# Patient Record
Sex: Female | Born: 1980 | Race: Black or African American | Hispanic: No | Marital: Single | Smoking: Never smoker
Health system: Southern US, Community
[De-identification: ages and names within clinical notes are randomized; demographics above are authoritative.]

## PROBLEM LIST (undated history)

## (undated) DIAGNOSIS — J45909 Unspecified asthma, uncomplicated: Secondary | ICD-10-CM

## (undated) DIAGNOSIS — M797 Fibromyalgia: Secondary | ICD-10-CM

## (undated) HISTORY — PX: TONSILLECTOMY: SUR1361

---

## 2013-07-28 ENCOUNTER — Emergency Department (HOSPITAL_COMMUNITY)
Admission: EM | Admit: 2013-07-28 | Discharge: 2013-07-28 | Disposition: A | Discharge status: Home / Self Care | Payer: Medicaid Other | Attending: Emergency Medicine | Admitting: Emergency Medicine

## 2013-07-28 ENCOUNTER — Encounter (HOSPITAL_COMMUNITY): Payer: Self-pay | Admitting: *Deleted

## 2013-07-28 DIAGNOSIS — J45909 Unspecified asthma, uncomplicated: Secondary | ICD-10-CM | POA: Insufficient documentation

## 2013-07-28 DIAGNOSIS — Z91040 Latex allergy status: Secondary | ICD-10-CM | POA: Insufficient documentation

## 2013-07-28 DIAGNOSIS — J329 Chronic sinusitis, unspecified: Secondary | ICD-10-CM | POA: Insufficient documentation

## 2013-07-28 DIAGNOSIS — R52 Pain, unspecified: Secondary | ICD-10-CM | POA: Insufficient documentation

## 2013-07-28 DIAGNOSIS — Z8739 Personal history of other diseases of the musculoskeletal system and connective tissue: Secondary | ICD-10-CM | POA: Insufficient documentation

## 2013-07-28 DIAGNOSIS — IMO0002 Reserved for concepts with insufficient information to code with codable children: Secondary | ICD-10-CM | POA: Insufficient documentation

## 2013-07-28 HISTORY — DX: Fibromyalgia: M79.7

## 2013-07-28 HISTORY — DX: Unspecified asthma, uncomplicated: J45.909

## 2013-07-28 LAB — RAPID STREP SCREEN (MED CTR MEBANE ONLY): Streptococcus, Group A Screen (Direct): NEGATIVE

## 2013-07-28 MED ORDER — TRIAMCINOLONE ACETONIDE(NASAL) 55 MCG/ACT NA INHA: 2.0000 | Freq: Every day | NASAL | Status: DC

## 2013-07-28 MED ORDER — SULFAMETHOXAZOLE-TMP DS 800-160 MG PO TABS
1.0000 | ORAL_TABLET | Freq: Once | ORAL | Status: AC
  Administered 2013-07-28: 1 via ORAL
  Filled 2013-07-28: qty 1

## 2013-07-28 MED ORDER — SULFAMETHOXAZOLE-TRIMETHOPRIM 800-160 MG PO TABS: 1.0000 | ORAL_TABLET | Freq: Two times a day (BID) | ORAL | Status: DC

## 2013-07-28 NOTE — ED Provider Notes (Signed)
Medical screening examination/treatment/procedure(s) were performed by non-physician practitioner and as supervising physician I was immediately available for consultation/collaboration.  Cobie Marcoux N Ricquel Foulk, DO 07/28/13 1608 

## 2013-07-28 NOTE — ED Provider Notes (Signed)
CSN: [id number]     Arrival date & time 07/28/13  0751 History   First MD Initiated Contact with Patient 07/28/13 0757     Chief Complaint  Patient presents with  . Sore Throat  . Generalized Body Aches   (Consider location/radiation/quality/duration/timing/severity/associated sxs/prior Treatment) Patient is a 32 y.o. female presenting with pharyngitis. The history is provided by the patient. No language interpreter was used.  Sore Throat This is a new problem. The current episode started in the past 7 days. Associated symptoms include chills, congestion, headaches, myalgias and a sore throat. Pertinent negatives include no chest pain, coughing, nausea, rash or vomiting. Associated symptoms comments: Sinus pressure, nasal congestion, sore throat for 4 days. She also complains of myalgias. Unknown fever as she has been taking ibuprofen regularly for aches. No significant cough, N, V..    Past Medical History  Diagnosis Date  . Asthma   . Fibromyalgia    History reviewed. No pertinent past surgical history. No family history on file. History  Substance Use Topics  . Smoking status: Never Smoker   . Smokeless tobacco: Not on file  . Alcohol Use: No   OB History   Grav Para Term Preterm Abortions TAB SAB Ect Mult Living                 Review of Systems  Constitutional: Positive for chills and appetite change.  HENT: Positive for congestion, sore throat and sinus pressure. Negative for ear pain and trouble swallowing.   Respiratory: Negative for cough and shortness of breath.   Cardiovascular: Negative for chest pain.  Gastrointestinal: Negative for nausea and vomiting.  Musculoskeletal: Positive for myalgias.  Skin: Negative for rash.  Neurological: Positive for headaches.    Allergies  Allegra; Claritin; and Latex  Home Medications   Current Outpatient Rx  Name  Route  Sig  Dispense  Refill  . albuterol (PROVENTIL HFA;VENTOLIN HFA) 108 (90 BASE) MCG/ACT inhaler  Inhalation   Inhale 2 puffs into the lungs every 6 (six) hours as needed for wheezing.         . beclomethasone (QVAR) 80 MCG/ACT inhaler   Inhalation   Inhale 1 puff into the lungs daily.          BP 119/71  Pulse 88  Temp(Src) 98 F (36.7 C) (Oral)  Resp 20  LMP 07/21/2013 Physical Exam  Constitutional: She appears well-developed and well-nourished.  HENT:  Head: Normocephalic.  Right Ear: External ear normal.  Left Ear: External ear normal.  Nose: Mucosal edema present. Right sinus exhibits frontal sinus tenderness. Left sinus exhibits frontal sinus tenderness.  Mouth/Throat: Uvula is midline. Posterior oropharyngeal erythema present. No tonsillar abscesses.  Neck: Normal range of motion. Neck supple.  Cardiovascular: Normal rate and normal heart sounds.   No murmur heard. Pulmonary/Chest: Effort normal and breath sounds normal. She has no wheezes. She has no rales.  Abdominal: Soft. Bowel sounds are normal. She exhibits no distension. There is no tenderness.  Musculoskeletal: Normal range of motion.  Lymphadenopathy:    She has no cervical adenopathy.  Skin: Skin is warm and dry. No pallor.    ED Course  Procedures (including critical care time) Labs Review Labs Reviewed  RAPID STREP SCREEN  CULTURE, GROUP A STREP   Results for orders placed during the hospital encounter of 07/28/13  RAPID STREP SCREEN      Result Value Range   Streptococcus, Group A Screen (Direct) NEGATIVE  NEGATIVE    Imaging Review No  results found.  MDM  No diagnosis found. 1. Sinusitis  Uncomplicated sinusitis with negative strep.     Arnoldo Hooker, PA-C 07/28/13 [phone]

## 2013-07-28 NOTE — ED Notes (Signed)
Pt is here with sore throat and body aches for 4 days.  No abdominal pain, vomiting, or diarrhea.

## 2013-07-30 LAB — CULTURE, GROUP A STREP

## 2013-12-27 ENCOUNTER — Encounter (HOSPITAL_COMMUNITY): Payer: Self-pay | Admitting: Emergency Medicine

## 2013-12-27 ENCOUNTER — Emergency Department (HOSPITAL_COMMUNITY)
Admission: EM | Admit: 2013-12-27 | Discharge: 2013-12-27 | Disposition: A | Discharge status: Home / Self Care | Payer: Medicaid Other | Attending: Emergency Medicine | Admitting: Emergency Medicine

## 2013-12-27 DIAGNOSIS — J45901 Unspecified asthma with (acute) exacerbation: Secondary | ICD-10-CM | POA: Insufficient documentation

## 2013-12-27 DIAGNOSIS — Z882 Allergy status to sulfonamides status: Secondary | ICD-10-CM | POA: Insufficient documentation

## 2013-12-27 DIAGNOSIS — IMO0001 Reserved for inherently not codable concepts without codable children: Secondary | ICD-10-CM | POA: Insufficient documentation

## 2013-12-27 DIAGNOSIS — R059 Cough, unspecified: Secondary | ICD-10-CM | POA: Insufficient documentation

## 2013-12-27 DIAGNOSIS — R0789 Other chest pain: Secondary | ICD-10-CM | POA: Insufficient documentation

## 2013-12-27 DIAGNOSIS — Z91040 Latex allergy status: Secondary | ICD-10-CM | POA: Insufficient documentation

## 2013-12-27 DIAGNOSIS — Z888 Allergy status to other drugs, medicaments and biological substances status: Secondary | ICD-10-CM | POA: Insufficient documentation

## 2013-12-27 DIAGNOSIS — R05 Cough: Secondary | ICD-10-CM | POA: Insufficient documentation

## 2013-12-27 DIAGNOSIS — R0602 Shortness of breath: Secondary | ICD-10-CM | POA: Insufficient documentation

## 2013-12-27 MED ORDER — PREDNISONE 20 MG PO TABS
60.0000 mg | ORAL_TABLET | Freq: Once | ORAL | Status: AC
  Administered 2013-12-27: 60 mg via ORAL
  Filled 2013-12-27: qty 3

## 2013-12-27 MED ORDER — BECLOMETHASONE DIPROPIONATE 80 MCG/ACT IN AERS: 2.0000 | INHALATION_SPRAY | Freq: Two times a day (BID) | RESPIRATORY_TRACT | Status: DC

## 2013-12-27 MED ORDER — ALBUTEROL SULFATE (2.5 MG/3ML) 0.083% IN NEBU: 2.5000 mg | INHALATION_SOLUTION | RESPIRATORY_TRACT | Status: DC | PRN

## 2013-12-27 MED ORDER — ALBUTEROL SULFATE (2.5 MG/3ML) 0.083% IN NEBU
5.0000 mg | INHALATION_SOLUTION | Freq: Once | RESPIRATORY_TRACT | Status: AC
  Administered 2013-12-27: 5 mg via RESPIRATORY_TRACT
  Filled 2013-12-27: qty 6

## 2013-12-27 MED ORDER — PREDNISONE (PAK) 10 MG PO TABS: ORAL_TABLET | Freq: Every day | ORAL | Status: DC

## 2013-12-27 MED ORDER — IPRATROPIUM BROMIDE 0.02 % IN SOLN
0.5000 mg | Freq: Once | RESPIRATORY_TRACT | Status: AC
  Administered 2013-12-27: 0.5 mg via RESPIRATORY_TRACT
  Filled 2013-12-27: qty 2.5

## 2013-12-27 MED ORDER — ALBUTEROL SULFATE HFA 108 (90 BASE) MCG/ACT IN AERS: 2.0000 | INHALATION_SPRAY | Freq: Four times a day (QID) | RESPIRATORY_TRACT | Status: DC | PRN

## 2013-12-27 NOTE — ED Notes (Signed)
Patient discharged to home with family. NAD.  

## 2013-12-27 NOTE — ED Provider Notes (Signed)
CSN: [id number]     Arrival date & time 12/27/13  6578 History   First MD Initiated Contact with Patient 12/27/13 [phone]     Chief Complaint  Patient presents with  . Asthma     (Consider location/radiation/quality/duration/timing/severity/associated sxs/prior Treatment) HPI Patient presents with exacerbation of her asthma.  Reports she has been out of her inhalers and neb treatment medications for the past week.  Started having wheezing, shortness of breath, tightness in her chest yesterday.  Gradually worsening. States this occurred because of the change in weather and this feels exactly like her asthma.  Denies fevers, sore throat, nasal congestion, recent illness.  Has had one albuterol neb treatment prior to our discussion and she states it is helping.    Past Medical History  Diagnosis Date  . Asthma   . Fibromyalgia    History reviewed. No pertinent past surgical history. History reviewed. No pertinent family history. History  Substance Use Topics  . Smoking status: Never Smoker   . Smokeless tobacco: Not on file  . Alcohol Use: No   OB History   Grav Para Term Preterm Abortions TAB SAB Ect Mult Living                 Review of Systems  Constitutional: Negative for fever.  HENT: Negative for sore throat and trouble swallowing.   Respiratory: Positive for cough, chest tightness, shortness of breath and wheezing.   Gastrointestinal: Negative for abdominal pain.  All other systems reviewed and are negative.      Allergies  Sulfa antibiotics; Allegra; Claritin; and Latex  Home Medications   Current Outpatient Rx  Name  Route  Sig  Dispense  Refill  . albuterol (PROVENTIL HFA;VENTOLIN HFA) 108 (90 BASE) MCG/ACT inhaler   Inhalation   Inhale 2 puffs into the lungs every 6 (six) hours as needed for wheezing.         . beclomethasone (QVAR) 80 MCG/ACT inhaler   Inhalation   Inhale 1 puff into the lungs daily.          BP 146/85  Pulse 94  Temp(Src) 97.6 F  (36.4 C) (Oral)  Resp 18  SpO2 91% Physical Exam  Nursing note and vitals reviewed. Constitutional: She appears well-developed and well-nourished. No distress.  HENT:  Head: Normocephalic and atraumatic.  Neck: Neck supple.  Cardiovascular: Normal rate and regular rhythm.   Pulmonary/Chest: Effort normal. No accessory muscle usage. Not tachypneic. No respiratory distress. She has wheezes. She has no rhonchi. She has no rales.  Diffuse inspiratory and expiratory wheezing in all fields  Abdominal: Soft. She exhibits no distension. There is no tenderness. There is no rebound and no guarding.  Neurological: She is alert.  Skin: She is not diaphoretic.    ED Course  Procedures (including critical care time) Labs Review Labs Reviewed - No data to display Imaging Review No results found.   EKG Interpretation None      9:38 AM Great improvement after nebs and steroids.  Pt reports she is ready to be d/c home.  Moving air well in all fields, small residual expiratory wheezing.  No rales or ronchi.  O2 100% on room air.   MDM   Final diagnoses:  Asthma exacerbation    Pt with hx asthma, out of her medications x 1 week, presents with typical asthma symptoms, triggered by change in weather.  Improving with neb treatment, steroids. Doubt PE, Pneumonia.  D/C home with refill of home asthma meds and  prednisone.  PCP follow up.  Discussed  findings, treatment, and follow up  with patient.  Pt given return precautions.  Pt verbalizes understanding and agrees with plan.        [place] Damone Fancher, PA-C 12/27/13 [phone]

## 2013-12-27 NOTE — ED Notes (Signed)
Reports hx of asthma, increase in sob yesterday but has been out of inhaler x 1 week. spo2 91% at triage.

## 2013-12-27 NOTE — ED Notes (Signed)
PA at bedside.

## 2013-12-27 NOTE — ED Notes (Signed)
Patient reports she ran out of her neb's and rescue inhaler 3 days ago and has had a cough and cold recently.

## 2013-12-27 NOTE — Discharge Instructions (Signed)
Read the information below.  Use the prescribed medication as directed.  Please discuss all new medications with your pharmacist.  You may return to the Emergency Department at any time for worsening condition or any new symptoms that concern you.  If you develop worsening shortness of breath, uncontrolled wheezing, severe chest pain, or fevers despite using tylenol and/or ibuprofen, return for a recheck.      Asthma, Adult Asthma is a recurring condition in which the airways tighten and narrow. Asthma can make it difficult to breathe. It can cause coughing, wheezing, and shortness of breath. Asthma episodes (also called asthma attacks) range from minor to life-threatening. Asthma cannot be cured, but medicines and lifestyle changes can help control it. CAUSES Asthma is believed to be caused by inherited (genetic) and environmental factors, but its exact cause is unknown. Asthma may be triggered by allergens, lung infections, or irritants in the air. Asthma triggers are different for each person. Common triggers include:   Animal dander.  Dust mites.  Cockroaches.  Pollen from trees or grass.  Mold.  Smoke.  Air pollutants such as dust, household cleaners, hair sprays, aerosol sprays, paint fumes, strong chemicals, or strong odors.  Cold air, weather changes, and winds (which increase molds and pollens in the air).  Strong emotional expressions such as crying or laughing hard.  Stress.  Certain medicines (such as aspirin) or types of drugs (such as beta-blockers).  Sulfites in foods and drinks. Foods and drinks that may contain sulfites include dried fruit, potato chips, and sparkling grape juice.  Infections or inflammatory conditions such as the flu, a cold, or an inflammation of the nasal membranes (rhinitis).  Gastroesophageal reflux disease (GERD).  Exercise or strenuous activity. SYMPTOMS Symptoms may occur immediately after asthma is triggered or many hours later.  Symptoms include:  Wheezing.  Excessive nighttime or early morning coughing.  Frequent or severe coughing with a common cold.  Chest tightness.  Shortness of breath. DIAGNOSIS  The diagnosis of asthma is made by a review of your medical history and a physical exam. Tests may also be performed. These may include:  Lung function studies. These tests show how much air you breath in and out.  Allergy tests.  Imaging tests such as X-rays. TREATMENT  Asthma cannot be cured, but it can usually be controlled. Treatment involves identifying and avoiding your asthma triggers. It also involves medicines. There are 2 classes of medicine used for asthma treatment:   Controller medicines. These prevent asthma symptoms from occurring. They are usually taken every day.  Reliever or rescue medicines. These quickly relieve asthma symptoms. They are used as needed and provide short-term relief. Your health care provider will help you create an asthma action plan. An asthma action plan is a written plan for managing and treating your asthma attacks. It includes a list of your asthma triggers and how they may be avoided. It also includes information on when medicines should be taken and when their dosage should be changed. An action plan may also involve the use of a device called a peak flow meter. A peak flow meter measures how well the lungs are working. It helps you monitor your condition. HOME CARE INSTRUCTIONS   Take medicine as directed by your health care provider. Speak with your health care provider if you have questions about how or when to take the medicines.  Use a peak flow meter as directed by your health care provider. Record and keep track of readings.  Understand  and use the action plan to help minimize or stop an asthma attack without needing to seek medical care.  Control your home environment in the following ways to help prevent asthma attacks:  Do not smoke. Avoid being exposed  to secondhand smoke.  Change your heating and air conditioning filter regularly.  Limit your use of fireplaces and wood stoves.  Get rid of pests (such as roaches and mice) and their droppings.  Throw away plants if you see mold on them.  Clean your floors and dust regularly. Use unscented cleaning products.  Try to have someone else vacuum for you regularly. Stay out of rooms while they are being vacuumed and for a short while afterward. If you vacuum, use a dust mask from a hardware store, a double-layered or microfilter vacuum cleaner bag, or a vacuum cleaner with a HEPA filter.  Replace carpet with wood, tile, or vinyl flooring. Carpet can trap dander and dust.  Use allergy-proof pillows, mattress covers, and box spring covers.  Wash bed sheets and blankets every week in hot water and dry them in a dryer.  Use blankets that are made of polyester or cotton.  Clean bathrooms and kitchens with bleach. If possible, have someone repaint the walls in these rooms with mold-resistant paint. Keep out of the rooms that are being cleaned and painted.  Wash hands frequently. SEEK MEDICAL CARE IF:   You have wheezing, shortness of breath, or a cough even if taking medicine to prevent attacks.  The colored mucus you cough up (sputum) is thicker than usual.  Your sputum changes from clear or white to yellow, green, gray, or bloody.  You have any problems that may be related to the medicines you are taking (such as a rash, itching, swelling, or trouble breathing).  You are using a reliever medicine more than 2 3 times per week.  Your peak flow is still at 50 79% of you personal best after following your action plan for 1 hour. SEEK IMMEDIATE MEDICAL CARE IF:   You seem to be getting worse and are unresponsive to treatment during an asthma attack.  You are short of breath even at rest.  You get short of breath when doing very little physical activity.  You have difficulty eating,  drinking, or talking due to asthma symptoms.  You develop chest pain.  You develop a fast heartbeat.  You have a bluish color to your lips or fingernails.  You are lightheaded, dizzy, or faint.  Your peak flow is less than 50% of your personal best.  You have a fever or persistent symptoms for more than 2 3 days.  You have a fever and symptoms suddenly get worse. MAKE SURE YOU:   Understand these instructions.  Will watch your condition.  Will get help right away if you are not doing well or get worse. Document Released: 10/16/2005 Document Revised: 06/18/2013 Document Reviewed: 05/15/2013 [facility] Patient Information 2014 [place], [place].    Emergency Department Resource Guide 1) Find a Doctor and Pay Out of Pocket Although you won't have to find out who is covered by your insurance plan, it is a good idea to ask around and get recommendations. You will then need to call the office and see if the doctor you have chosen will accept you as a new patient and what types of options they offer for patients who are self-pay. Some doctors offer discounts or will set up payment plans for their patients who do not have insurance, but  you will need to ask so you aren't surprised when you get to your appointment.  2) Contact Your Local Health Department Not all health departments have doctors that can see patients for sick visits, but many do, so it is worth a call to see if yours does. If you don't know where your local health department is, you can check in your phone book. The CDC also has a tool to help you locate your state's health department, and many state websites also have listings of all of their local health departments.  3) Find a Walk-in Clinic If your illness is not likely to be very severe or complicated, you may want to try a walk in clinic. These are popping up all over the country in pharmacies, drugstores, and shopping centers. They're usually staffed by nurse  practitioners or physician assistants that have been trained to treat common illnesses and complaints. They're usually fairly quick and inexpensive. However, if you have serious medical issues or chronic medical problems, these are probably not your best option.  No Primary Care Doctor: - Call Health Connect at  [phone] - they can help you locate a primary care doctor that  accepts your insurance, provides certain services, etc. - Physician Referral Service- [phone]  Chronic Pain Problems: Organization         Address  Phone   Notes  [facility]  [phone] Patients need to be referred by their primary care doctor.   Medication Assistance: Organization         Address  Phone   Notes  [facility] Medication [facility] [address] [place], [place] [address] [phone] --Must be a resident of [facility] -- Must have NO insurance coverage whatsoever (no Medicaid/ Medicare, etc.) -- The pt. MUST have a primary care doctor that directs their care regularly and follows them in the community   Med[facility]ist  [phone]   [employer]  [phone]    Agencies that provide inexpensive medical care: Organization         Address  Phone   Notes  [facility]  [phone]   [facility]    [phone]   [facility] [address] [place], [place] [address] [phone]   [facility] [address] [place], [place] [phone]   Planned Parenthood    [phone]   [facility]    [phone]   [facility]  [address], [place] Phone:  [phone], Fax:  [phone] Hours of Operation:  9 am - 6 pm, M-F.  Also accepts Medicaid/Medicare and self-pay.  [facility]  [address], [place] Phone: [phone], Fax: [phone]. Hours of Operation:  8:30 am -  5:30 pm, M-F.  Also accepts Medicaid and self-pay.  [facility] [place] [address], [place] Phone: [phone]   [facility] [address] Natasha Bence [place], [place] [phone], Ext. [phone] Mondays & Thursdays: 7-9 AM.  First 15 patients are seen on a first come, first serve basis.    Medicaid-accepting [facility] Providers:  Organization         Address  Phone   Notes  [facility] [address], [place] [phone] Also accepts self-pay patients.  [facility] [address] [place], [place]  [address] [place]  [phone]   [facility] [address], [place] [phone]   [facility] [address] [place] [phone]   Renaye Rakers [address], [place]   [phone] Only accepts [place] Access [place] patients after they have their name applied to their card.   Self-Pay (no insurance) in [facility]:  Organization         Address  Phone   Notes  Sickle Cell Patients, [facility] [address] [place], [place] [phone]   [facility] [address] [place], [place] [phone]   [facility] [place]  [address], [place] [phone]   [facility]/Dr. Osei-Bonsu  [address] [place] or [address], [place] [phone] Phone number for both [place] and [place] locations is the same.  [facility] [address] [place] [phone]   [facility] [address] [place] or [address] [phone]   [facility] [address], [place] [phone], phone; [phone], fax Sees patients 1st and 3rd Saturday of every month.  Must not qualify for public or private insurance (i.e. Medicaid, Medicare, [place] Health Choice,  Veterans' Benefits)  Household income should be no more than 200% of the poverty level The clinic cannot treat you if you are pregnant or think you are pregnant  Sexually transmitted diseases are not treated at the clinic.    Dental Care: Organization         Address  Phone  Notes  [facility] of [facility] [address] [place], [place] [phone] Accepts children up to age 37 who are enrolled in [place] or [place] Health Choice; pregnant women with a Medicaid card; and children who have applied for Medicaid or [place] Health Choice, but were declined, whose parents can pay a reduced fee at time of service.  [facility]  [address], [place] [phone] Accepts children up to age 44 who are enrolled in [place] or [place] Health Choice; pregnant women with a Medicaid card; and children who have applied for Medicaid or [place] Health Choice, but were declined, whose parents can pay a reduced fee at time of service.  [facility]  [address] [place], [place] [phone] Patients are seen by appointment only. Walk-ins are not accepted. [facility] will see patients 81 years of age and older. Monday - Tuesday (8am-5pm) Most Wednesdays (8:30-5pm) $30 per visit, cash only  [facility] Access PRO[facility]  [address], [facility] [phone] Patients are seen by appointment only. Walk-ins are not accepted. [facility] will see patients 52 years of age and older. One Wednesday Evening (Monthly: Volunteer Based).  $30 per visit, cash only  [employer]  [phone] for adults; Children under age 109, call Graduate Pediatric Dentistry at [phone]. Children aged 79-14, please call [phone] to request a pediatric application.  Dental services are provided in all areas of dental care including fillings, crowns and bridges, complete and  partial dentures, implants, gum treatment, root canals, and extractions. Preventive care is also provided. Treatment is provided to both adults and children. Patients are selected via a lottery  and there is often a waiting list.   [facility] [address], [place]  [phone] [email or web address]   [facility] [address] [place], [place] [phone], Ext. [phone] Second and Fourth Thursday of each month, opens at 6:30 AM; Clinic ends at 9 AM.  Patients are seen on a first-come first-served basis, and a limited number are seen during each clinic.   [facility]  [address] [place], [place] [phone]   Eligibility Requirements You must have lived in [place], [place], or [place] counties for at least the last three months.   You cannot be eligible for state or federal sponsored [place], including [employer], [place], or [employer].   You generally cannot be eligible for healthcare insurance through your employer.    How to apply: Eligibility screenings are held every Tuesday and Wednesday afternoon from 1:00 pm until 4:00 pm. You do not need an appointment for the interview!  [facility] [address] [place], [place] [phone]   [facility] Health [facility]  [phone]   [facility] Health Department  [phone]   [facility] Health Department  [phone]    Behavioral Health Resources in the Community: Intensive Outpatient Programs Organization         Address  Phone  Notes  [facility] Services [address] [place], [place] [phone]   [facility] Outpatient [address], [place], [place] [phone]   [facility] [address], [place], [place]  [phone]   [facility] Mental Health [address],  [place], [place] [phone] or [phone]   Substance Abuse Resources Organization          Address  Phone  Notes  [facility] Drug [facility]  [phone]   [facility]  [phone]   [facility] [place]  [phone]   Floydene Flock  [phone]   Residential & Outpatient Substance Abuse Program  [phone]   Psychological Services Organization         Address  Phone  Notes  [facility] Behavioral Health  [phone]   [facility]  [phone]   [facility] [address] [place] [phone] or [phone]    Mobile Crisis Teams Organization         Address  Phone  Notes  [facility], Mobile Crisis Care Unit  [phone]   [facility]  [address] [place], [place] [phone]   Doristine Locks [address] [place] [phone]    Self-Help/Support Groups Organization         Address  Phone             Notes  [facility] [place] - variety of support groups  336- I7437963 Call for more information  Narcotics Anonymous (NA), [facility] [address], [employer] [place]  2 meetings at this location   Statistician         Address  Phone  Notes  [facility] [address],    [place]  [phone]   [facility]  [address], [place] [address], [place], [place] [phone]   [facility] Treatment Facility [address] [place], [place] [phone] Admissions: 8am-3pm M-F  [facility] [address]    [place], [place] [phone]   [facility] [address] [address], [place], [place]  [phone]   [facility] [address]  [place], [place] [phone]   [facility] - Intensive Outpatient [address], [place], [place] [phone]   [facility] [address]  [place], [place] [phone] or [phone]   [facility] ([facility]) [address] [place], [place]  [phone] Accepts Medicaid  [facility] [address]  [place] [phone] Substance Abuse/Addiction Treatment   [facility] Behavioral Health Resources Organization         Address  Phone  Notes  [facility]  [phone]   Angie FavaJulie Brannon, PhD [address] [place], [place]   [phone] or [phone]   [facility]   [address] [place], [place] [phone]   [facility] [address], [place], [place] [phone] Insurance/Medicaid/sponsorship through [facility]  [facility] [address]                                    [place], [place] [phone] Therapy/tele-psych/case  [facility] [address]   [place], [place] [phone]    Dr. Lolly MustacheArfeen  [phone]   [facility]  [employer] [facility] 1) [address], [place] 2) [address], [place] 3)  [address], [place] [phone]) [phone]  [phone](336) [phone]   [facility] Child Abuse Hotline [phone](336) [phone] or [phone]) [phone] (After Hours)

## 2013-12-28 NOTE — ED Provider Notes (Signed)
Medical screening examination/treatment/procedure(s) were performed by non-physician practitioner and as supervising physician I was immediately available for consultation/collaboration.    Jaydon Avina L Tashonna Descoteaux, MD 12/28/13 0817 

## 2014-06-09 ENCOUNTER — Ambulatory Visit: Payer: Medicaid Other | Attending: Internal Medicine | Admitting: Internal Medicine

## 2014-06-09 ENCOUNTER — Encounter: Payer: Self-pay | Admitting: Internal Medicine

## 2014-06-09 VITALS — BP 111/75 | HR 72 | Temp 98.2°F | Resp 16 | Ht 69.0 in | Wt 238.0 lb

## 2014-06-09 DIAGNOSIS — Z881 Allergy status to other antibiotic agents status: Secondary | ICD-10-CM | POA: Insufficient documentation

## 2014-06-09 DIAGNOSIS — Z91040 Latex allergy status: Secondary | ICD-10-CM | POA: Diagnosis not present

## 2014-06-09 DIAGNOSIS — Z139 Encounter for screening, unspecified: Secondary | ICD-10-CM

## 2014-06-09 DIAGNOSIS — J45909 Unspecified asthma, uncomplicated: Secondary | ICD-10-CM | POA: Insufficient documentation

## 2014-06-09 LAB — CBC WITH DIFFERENTIAL/PLATELET
Basophils Absolute: 0 10*3/uL (ref 0.0–0.1)
Basophils Relative: 0 % (ref 0–1)
Eosinophils Absolute: 0.6 10*3/uL (ref 0.0–0.7)
Eosinophils Relative: 10 % — ABNORMAL HIGH (ref 0–5)
HCT: 39.8 % (ref 36.0–46.0)
Hemoglobin: 13.4 g/dL (ref 12.0–15.0)
Lymphocytes Relative: 29 % (ref 12–46)
Lymphs Abs: 1.7 10*3/uL (ref 0.7–4.0)
MCH: 26.8 pg (ref 26.0–34.0)
MCHC: 33.7 g/dL (ref 30.0–36.0)
MCV: 79.6 fL (ref 78.0–100.0)
MONOS PCT: 11 % (ref 3–12)
Monocytes Absolute: 0.6 10*3/uL (ref 0.1–1.0)
NEUTROS ABS: 3 10*3/uL (ref 1.7–7.7)
Neutrophils Relative %: 50 % (ref 43–77)
Platelets: 301 10*3/uL (ref 150–400)
RBC: 5 MIL/uL (ref 3.87–5.11)
RDW: 15 % (ref 11.5–15.5)
WBC: 5.9 10*3/uL (ref 4.0–10.5)

## 2014-06-09 MED ORDER — ALBUTEROL SULFATE HFA 108 (90 BASE) MCG/ACT IN AERS: 2.0000 | INHALATION_SPRAY | Freq: Four times a day (QID) | RESPIRATORY_TRACT | Status: DC | PRN

## 2014-06-09 MED ORDER — ALBUTEROL SULFATE (2.5 MG/3ML) 0.083% IN NEBU: 2.5000 mg | INHALATION_SOLUTION | RESPIRATORY_TRACT | Status: DC | PRN

## 2014-06-09 MED ORDER — BECLOMETHASONE DIPROPIONATE 80 MCG/ACT IN AERS: 2.0000 | INHALATION_SPRAY | Freq: Two times a day (BID) | RESPIRATORY_TRACT | Status: DC

## 2014-06-09 MED ORDER — NORETHIN ACE-ETH ESTRAD-FE 1-20 MG-MCG PO TABS: 1.0000 | ORAL_TABLET | Freq: Every day | ORAL | Status: DC

## 2014-06-09 NOTE — Progress Notes (Signed)
Patient Demographics  Bonnie Wilson, is a 33 y.o. female  ZOX:[id number]  WJX:[id number]  DOB - [birth year]  CC:  Chief Complaint  Patient presents with  . Establish Care  . Asthma       HPI: Bonnie BrilliantCamille Wilson is a 33 y.o. female here today to establish medical care. Patient has history of asthma and is requesting refill on her medications, she is taking albuterol when necessary and is on Qvar daily, she denies smoking cigarettes, denies any acute symptoms.  Patient has No headache, No chest pain, No abdominal pain - No Nausea, No new weakness tingling or numbness, No Cough - SOB.  Allergies  Allergen Reactions  . Sulfa Antibiotics Anaphylaxis and Rash  . Allegra [Fexofenadine Hcl] Hives and Other (See Comments)    Causes hyperactivity   . Claritin [Loratadine] Hives and Other (See Comments)    Cause hyperactivity  . Latex Hives and Rash   Past Medical History  Diagnosis Date  . Asthma   . Fibromyalgia    Current Outpatient Prescriptions on File Prior to Visit  Medication Sig Dispense Refill  . predniSONE (STERAPRED UNI-PAK) 10 MG tablet Take by mouth daily. Day 1: take 6 tabs.  Day 2: 5 tabs  Day 3: 4 tabs  Day 4: 3 tabs  Day 5: 2 tabs  Day 6: 1 tab  21 tablet  0  . pseudoephedrine-guaifenesin (MUCINEX D) 60-600 MG per tablet Take 1 tablet by mouth every 12 (twelve) hours.      . triamcinolone cream (KENALOG) 0.5 % Apply 1 application topically daily as needed (eczema flares).       No current facility-administered medications on file prior to visit.   Family History  Problem Relation Age of Onset  . Hypertension Mother   . Asthma Father   . Hypertension Father   . Asthma Paternal Aunt    History   Social History  . Marital Status: Single    Spouse Name: N/A    Number of Children: N/A  . Years of Education: N/A   Occupational History  . Not on file.   Social History Main Topics  . Smoking status: Never Smoker   . Smokeless tobacco: Not on file  .  Alcohol Use: No  . Drug Use: No  . Sexual Activity: Not on file   Other Topics Concern  . Not on file   Social History Narrative  . No narrative on file    Review of Systems: Constitutional: Negative for fever, chills, diaphoresis, activity change, appetite change and fatigue. HENT: Negative for ear pain, nosebleeds, congestion, facial swelling, rhinorrhea, neck pain, neck stiffness and ear discharge.  Eyes: Negative for pain, discharge, redness, itching and visual disturbance. Respiratory: Negative for cough, choking, chest tightness, shortness of breath, wheezing and stridor.  Cardiovascular: Negative for chest pain, palpitations and leg swelling. Gastrointestinal: Negative for abdominal distention. Genitourinary: Negative for dysuria, urgency, frequency, hematuria, flank pain, decreased urine volume, difficulty urinating and dyspareunia.  Musculoskeletal: Negative for back pain, joint swelling, arthralgia and gait problem. Neurological: Negative for dizziness, tremors, seizures, syncope, facial asymmetry, speech difficulty, weakness, light-headedness, numbness and headaches.  Hematological: Negative for adenopathy. Does not bruise/bleed easily. Psychiatric/Behavioral: Negative for hallucinations, behavioral problems, confusion, dysphoric mood, decreased concentration and agitation.    Objective:   Filed Vitals:   06/09/14 1206  BP: 111/75  Pulse: 72  Temp: 98.2 F (36.8 C)  Resp: 16    Physical Exam: Constitutional: Patient appears well-developed and well-nourished. No  distress. HENT: Normocephalic, atraumatic, External right and left ear normal. Oropharynx is clear and moist.  Eyes: Conjunctivae and EOM are normal. PERRLA, no scleral icterus. Neck: Normal ROM. Neck supple. No JVD. No tracheal deviation. No thyromegaly. CVS: RRR, S1/S2 +, no murmurs, no gallops, no carotid bruit.  Pulmonary: Effort and breath sounds normal, no stridor, rhonchi, wheezes, rales.    Abdominal: Soft. BS +, no distension, tenderness, rebound or guarding.  Musculoskeletal: Normal range of motion. No edema and no tenderness.  Neuro: Alert. Normal reflexes, muscle tone coordination. No cranial nerve deficit. Skin: Skin is warm and dry. No rash noted. Not diaphoretic. No erythema. No pallor. Psychiatric: Normal mood and affect. Behavior, judgment, thought content normal.  No results found for this basename: WBC, HGB, HCT, MCV, PLT   No results found for this basename: CREATININE, BUN, NA, K, CL, CO2    No results found for this basename: HGBA1C   Lipid Panel  No results found for this basename: chol, trig, hdl, cholhdl, vldl, ldlcalc       Assessment and plan:   1. Unspecified asthma(493.90)  Patient is given refill on her medications.  - albuterol (PROVENTIL HFA;VENTOLIN HFA) 108 (90 BASE) MCG/ACT inhaler; Inhale 2 puffs into the lungs every 6 (six) hours as needed for wheezing or shortness of breath.  Dispense: 1 Inhaler; Refill: 3 - albuterol (PROVENTIL) (2.5 MG/3ML) 0.083% nebulizer solution; Take 3 mLs (2.5 mg total) by nebulization every 4 (four) hours as needed for wheezing or shortness of breath.  Dispense: 30 vial; Refill: 1 - beclomethasone (QVAR) 80 MCG/ACT inhaler; Inhale 2 puffs into the lungs 2 (two) times daily.  Dispense: 1 Inhaler; Refill: 3  2. Screening We'll do baseline blood work.  - CBC with Differential - COMPLETE METABOLIC PANEL WITH GFR - TSH - Lipid panel - Vit D  25 hydroxy (rtn osteoporosis monitoring)   Health Maintenance  -Pap Smear: last PAP done  In 08/2013   Return in about 3 months (around 09/09/2014) for asthma.   Doris Cheadle, MD

## 2014-06-09 NOTE — Progress Notes (Signed)
Pt here to establish care for Asthma with medication management Pt need refills on all meds No other medical problems reported LMP- 05/27/14 Denies sob or pain

## 2014-06-10 LAB — COMPLETE METABOLIC PANEL WITH GFR
ALBUMIN: 4.2 g/dL (ref 3.5–5.2)
ALK PHOS: 87 U/L (ref 39–117)
ALT: 14 U/L (ref 0–35)
AST: 14 U/L (ref 0–37)
BUN: 12 mg/dL (ref 6–23)
CO2: 28 mEq/L (ref 19–32)
Calcium: 9.6 mg/dL (ref 8.4–10.5)
Chloride: 103 mEq/L (ref 96–112)
Creat: 0.8 mg/dL (ref 0.50–1.10)
GFR, Est African American: >89 mL/min
GLUCOSE: 83 mg/dL (ref 70–99)
POTASSIUM: 4.7 meq/L (ref 3.5–5.3)
SODIUM: 136 meq/L (ref 135–145)
TOTAL PROTEIN: 7.2 g/dL (ref 6.0–8.3)
Total Bilirubin: 0.4 mg/dL (ref 0.2–1.2)

## 2014-06-10 LAB — LIPID PANEL
Cholesterol: 174 mg/dL (ref 0–200)
HDL: 53 mg/dL (ref >39)
LDL CALC: 101 mg/dL — AB (ref 0–99)
Total CHOL/HDL Ratio: 3.3 Ratio
Triglycerides: 102 mg/dL (ref <150)
VLDL: 20 mg/dL (ref 0–40)

## 2014-06-10 LAB — VITAMIN D 25 HYDROXY (VIT D DEFICIENCY, FRACTURES): VIT D 25 HYDROXY: 18 ng/mL — AB (ref 30–89)

## 2014-06-10 LAB — TSH: TSH: 0.766 u[IU]/mL (ref 0.350–4.500)

## 2014-06-11 ENCOUNTER — Telehealth: Payer: Self-pay | Admitting: Emergency Medicine

## 2014-06-11 MED ORDER — VITAMIN D (ERGOCALCIFEROL) 1.25 MG (50000 UNIT) PO CAPS: 50000.0000 [IU] | ORAL_CAPSULE | ORAL | Status: DC

## 2014-06-11 NOTE — Telephone Encounter (Signed)
Left message for pt to call when message received. [facility]

## 2014-06-11 NOTE — Telephone Encounter (Signed)
Message copied by Darlis LoanSMITH, Cederic Mozley D on Thu Jun 11, 2014  3:52 PM ------      Message from: Doris CheadleADVANI, DEEPAK      Created: Wed Jun 10, 2014 10:40 AM       Blood work reviewed, noticed low vitamin D, call patient advise to start ergocalciferol 50,000 units once a week for the duration of  12 weeks.       ------

## 2014-11-07 ENCOUNTER — Other Ambulatory Visit: Payer: Self-pay | Admitting: Internal Medicine

## 2014-11-09 ENCOUNTER — Other Ambulatory Visit: Payer: Self-pay | Admitting: Internal Medicine

## 2014-11-09 ENCOUNTER — Other Ambulatory Visit: Payer: Self-pay | Admitting: *Deleted

## 2014-11-09 DIAGNOSIS — J4520 Mild intermittent asthma, uncomplicated: Secondary | ICD-10-CM

## 2014-11-09 MED ORDER — ALBUTEROL SULFATE (2.5 MG/3ML) 0.083% IN NEBU: 2.5000 mg | INHALATION_SOLUTION | RESPIRATORY_TRACT | Status: DC | PRN

## 2014-11-09 MED ORDER — ALBUTEROL SULFATE HFA 108 (90 BASE) MCG/ACT IN AERS: 2.0000 | INHALATION_SPRAY | Freq: Four times a day (QID) | RESPIRATORY_TRACT | Status: DC | PRN

## 2014-11-09 MED ORDER — BECLOMETHASONE DIPROPIONATE 80 MCG/ACT IN AERS: 2.0000 | INHALATION_SPRAY | Freq: Two times a day (BID) | RESPIRATORY_TRACT | Status: DC

## 2014-11-09 NOTE — Telephone Encounter (Signed)
Patient calling to request medication refill for the following medications: albuterol (PROVENTIL HFA;VENTOLIN HFA) 108 (90 BASE) MCG/ACT inhaler,   albuterol (PROVENTIL) (2.5 MG/3ML) 0.083% nebulizer solution,    beclomethasone (QVAR) 80 MCG/ACT inhaler, and norethindrone-ethinyl estradiol (JUNEL FE,GILDESS FE,LOESTRIN FE) 1-20 MG-MCG tablet Patient has not been seen in clinic since August. Scheduled patient appointment for OV, 11/17/14. Please assist.

## 2014-11-09 NOTE — Telephone Encounter (Signed)
Pt aware Inh refill send to [facility] Advised to maintain ov with PCP

## 2014-11-10 ENCOUNTER — Other Ambulatory Visit: Payer: Self-pay | Admitting: Internal Medicine

## 2014-11-17 ENCOUNTER — Ambulatory Visit: Payer: Medicaid Other | Attending: Internal Medicine | Admitting: Internal Medicine

## 2014-11-17 ENCOUNTER — Encounter: Payer: Self-pay | Admitting: Internal Medicine

## 2014-11-17 VITALS — BP 116/72 | HR 86 | Temp 98.2°F | Resp 16 | Wt 242.0 lb

## 2014-11-17 DIAGNOSIS — E559 Vitamin D deficiency, unspecified: Secondary | ICD-10-CM

## 2014-11-17 DIAGNOSIS — J4520 Mild intermittent asthma, uncomplicated: Secondary | ICD-10-CM

## 2014-11-17 MED ORDER — VITAMIN D (ERGOCALCIFEROL) 1.25 MG (50000 UNIT) PO CAPS: 50000.0000 [IU] | ORAL_CAPSULE | ORAL | Status: DC

## 2014-11-17 MED ORDER — BECLOMETHASONE DIPROPIONATE 80 MCG/ACT IN AERS: 2.0000 | INHALATION_SPRAY | Freq: Two times a day (BID) | RESPIRATORY_TRACT | Status: DC

## 2014-11-17 MED ORDER — ALBUTEROL SULFATE (2.5 MG/3ML) 0.083% IN NEBU: 2.5000 mg | INHALATION_SOLUTION | RESPIRATORY_TRACT | Status: DC | PRN

## 2014-11-17 MED ORDER — ALBUTEROL SULFATE HFA 108 (90 BASE) MCG/ACT IN AERS: 2.0000 | INHALATION_SPRAY | Freq: Four times a day (QID) | RESPIRATORY_TRACT | Status: DC | PRN

## 2014-11-17 MED ORDER — NORETHIN ACE-ETH ESTRAD-FE 1-20 MG-MCG PO TABS: 1.0000 | ORAL_TABLET | Freq: Every day | ORAL | Status: DC

## 2014-11-17 NOTE — Progress Notes (Signed)
Patient here for follow up on her asthma and for medication refills

## 2014-11-17 NOTE — Progress Notes (Signed)
MRN: [id number] Name: Bonnie Wilson  Sex: female Age: 34 y.o. DOB: [birth year]  Allergies: Sulfa antibiotics; Allegra; Claritin; and Latex  Chief Complaint  Patient presents with  . Follow-up    HPI: Patient is 34 y.o. female who comes today for follow up, she's requesting refill on her medications chief Qvar and albuterol when necessary, she denies smoking cigarettes, previous blood work reviewed with the patient noticed vitamin D deficiency as per patient she has not taken supplement and would like another prescription. Currently she denies any acute symptoms denies any headache dizziness chest and shortness of breath  Past Medical History  Diagnosis Date  . Asthma   . Fibromyalgia     History reviewed. No pertinent past surgical history.    Medication List       This list is accurate as of: 11/17/14 12:45 PM.  Always use your most recent med list.               albuterol 108 (90 BASE) MCG/ACT inhaler  Commonly known as:  PROVENTIL HFA;VENTOLIN HFA  Inhale 2 puffs into the lungs every 6 (six) hours as needed for wheezing or shortness of breath.     albuterol (2.5 MG/3ML) 0.083% nebulizer solution  Commonly known as:  PROVENTIL  Take 3 mLs (2.5 mg total) by nebulization every 4 (four) hours as needed for wheezing or shortness of breath.     beclomethasone 80 MCG/ACT inhaler  Commonly known as:  QVAR  Inhale 2 puffs into the lungs 2 (two) times daily.     norethindrone-ethinyl estradiol 1-20 MG-MCG tablet  Commonly known as:  JUNEL FE 1/20  Take 1 tablet by mouth daily.     predniSONE 10 MG tablet  Commonly known as:  STERAPRED UNI-PAK  Take by mouth daily. Day 1: take 6 tabs.  Day 2: 5 tabs  Day 3: 4 tabs  Day 4: 3 tabs  Day 5: 2 tabs  Day 6: 1 tab     pseudoephedrine-guaifenesin 60-600 MG per tablet  Commonly known as:  MUCINEX D  Take 1 tablet by mouth every 12 (twelve) hours.     triamcinolone cream 0.5 %  Commonly known as:  KENALOG  Apply 1  application topically daily as needed (eczema flares).     Vitamin D (Ergocalciferol) 50000 UNITS Caps capsule  Commonly known as:  DRISDOL  Take 1 capsule (50,000 Units total) by mouth every 7 (seven) days.        Meds ordered this encounter  Medications  . albuterol (PROVENTIL HFA;VENTOLIN HFA) 108 (90 BASE) MCG/ACT inhaler    Sig: Inhale 2 puffs into the lungs every 6 (six) hours as needed for wheezing or shortness of breath.    Dispense:  1 Inhaler    Refill:  2  . albuterol (PROVENTIL) (2.5 MG/3ML) 0.083% nebulizer solution    Sig: Take 3 mLs (2.5 mg total) by nebulization every 4 (four) hours as needed for wheezing or shortness of breath.    Dispense:  30 vial    Refill:  2  . beclomethasone (QVAR) 80 MCG/ACT inhaler    Sig: Inhale 2 puffs into the lungs 2 (two) times daily.    Dispense:  1 Inhaler    Refill:  2  . norethindrone-ethinyl estradiol (JUNEL FE 1/20) 1-20 MG-MCG tablet    Sig: Take 1 tablet by mouth daily.    Dispense:  28 tablet    Refill:  3  . Vitamin D, Ergocalciferol, (DRISDOL) 50000 UNITS  CAPS capsule    Sig: Take 1 capsule (50,000 Units total) by mouth every 7 (seven) days.    Dispense:  12 capsule    Refill:  0     There is no immunization history on file for this patient.  Family History  Problem Relation Age of Onset  . Hypertension Mother   . Asthma Father   . Hypertension Father   . Asthma Paternal Aunt     History  Substance Use Topics  . Smoking status: Never Smoker   . Smokeless tobacco: Not on file  . Alcohol Use: No    Review of Systems   As noted in HPI  Filed Vitals:   11/17/14 1225  BP: 116/72  Pulse: 86  Temp: 98.2 F (36.8 C)  Resp: 16    Physical Exam  Physical Exam  Constitutional: No distress.  Eyes: EOM are normal. Pupils are equal, round, and reactive to light.  Cardiovascular: Normal rate and regular rhythm.   Pulmonary/Chest: Breath sounds normal. No respiratory distress. She has no wheezes. She has  no rales.  Musculoskeletal: She exhibits no edema.    CBC    Component Value Date/Time   WBC 5.9 06/09/2014 1221   RBC 5.00 06/09/2014 1221   HGB 13.4 06/09/2014 1221   HCT 39.8 06/09/2014 1221   PLT 301 06/09/2014 1221   MCV 79.6 06/09/2014 1221   LYMPHSABS 1.7 06/09/2014 1221   MONOABS 0.6 06/09/2014 1221   EOSABS 0.6 06/09/2014 1221   BASOSABS 0.0 06/09/2014 1221    CMP     Component Value Date/Time   NA 136 06/09/2014 1221   K 4.7 06/09/2014 1221   CL 103 06/09/2014 1221   CO2 28 06/09/2014 1221   GLUCOSE 83 06/09/2014 1221   BUN 12 06/09/2014 1221   CREATININE 0.80 06/09/2014 1221   CALCIUM 9.6 06/09/2014 1221   PROT 7.2 06/09/2014 1221   ALBUMIN 4.2 06/09/2014 1221   AST 14 06/09/2014 1221   ALT 14 06/09/2014 1221   ALKPHOS 87 06/09/2014 1221   BILITOT 0.4 06/09/2014 1221   GFRNONAA >89 06/09/2014 1221   GFRAA >89 06/09/2014 1221    Lab Results  Component Value Date/Time   CHOL 174 06/09/2014 12:21 PM    No components found for: HGA1C  Lab Results  Component Value Date/Time   AST 14 06/09/2014 12:21 PM    Assessment and Plan  Childhood asthma, mild intermittent, uncomplicated - Plan: continue with current medsalbuterol (PROVENTIL HFA;VENTOLIN HFA) 108 (90 BASE) MCG/ACT inhaler, albuterol (PROVENTIL) (2.5 MG/3ML) 0.083% nebulizer solution, beclomethasone (QVAR) 80 MCG/ACT inhaler  Vitamin D deficiency - Plan: prescribed Vitamin D, Ergocalciferol, (DRISDOL) 50000 UNITS CAPS capsule   Health Maintenance  -Vaccinations:  Patient will think about pneumovax   Return in about 6 months (around 05/18/2015), or if symptoms worsen or fail to improve, for asthma.  Doris CheadleADVANI, Zenith Lamphier, MD

## 2015-02-03 ENCOUNTER — Other Ambulatory Visit: Payer: Self-pay | Admitting: Internal Medicine

## 2015-03-09 ENCOUNTER — Other Ambulatory Visit: Payer: Self-pay | Admitting: Internal Medicine

## 2015-03-19 ENCOUNTER — Other Ambulatory Visit: Payer: Self-pay | Admitting: *Deleted

## 2015-03-19 NOTE — Telephone Encounter (Signed)
Patient called in for refills of Qvar and Proventil but refills had already been processed.  Patient called.

## 2015-05-12 ENCOUNTER — Other Ambulatory Visit: Payer: Self-pay | Admitting: Internal Medicine

## 2015-06-04 ENCOUNTER — Other Ambulatory Visit: Payer: Self-pay | Admitting: Internal Medicine

## 2015-06-04 DIAGNOSIS — J45909 Unspecified asthma, uncomplicated: Secondary | ICD-10-CM

## 2015-07-02 ENCOUNTER — Telehealth: Payer: Self-pay | Admitting: Internal Medicine

## 2015-07-13 NOTE — Telephone Encounter (Signed)
Patient presents asking for refills of Albuterol and Qvar. She is out of Qvar and only has 1 refill left for Albuterol. Please f/u with patient ASAP.

## 2015-07-16 ENCOUNTER — Telehealth: Payer: Self-pay

## 2015-07-16 MED ORDER — ALBUTEROL SULFATE HFA 108 (90 BASE) MCG/ACT IN AERS: INHALATION_SPRAY | RESPIRATORY_TRACT | Status: DC

## 2015-07-16 MED ORDER — ALBUTEROL SULFATE (2.5 MG/3ML) 0.083% IN NEBU: INHALATION_SOLUTION | RESPIRATORY_TRACT | Status: DC

## 2015-07-16 NOTE — Telephone Encounter (Signed)
Returned patient phone call Patient not available on either number Messages left on both house and cell phone Patient's inhaler and neb solutions sent to pharmacy No additional refills Patient needs to schedule an appointment to see her provider Patient was supposed to return this past july

## 2015-07-16 NOTE — Telephone Encounter (Signed)
Patient called to request  Med refills for Albuterol and Qvar. Please f/u with pt.

## 2015-10-04 ENCOUNTER — Other Ambulatory Visit: Payer: Self-pay

## 2015-10-04 MED ORDER — ALBUTEROL SULFATE HFA 108 (90 BASE) MCG/ACT IN AERS: INHALATION_SPRAY | RESPIRATORY_TRACT | Status: DC

## 2015-10-04 MED ORDER — BECLOMETHASONE DIPROPIONATE 80 MCG/ACT IN AERS: INHALATION_SPRAY | RESPIRATORY_TRACT | Status: DC

## 2015-11-23 ENCOUNTER — Other Ambulatory Visit: Payer: Self-pay | Admitting: Internal Medicine

## 2015-11-23 ENCOUNTER — Telehealth: Payer: Self-pay | Admitting: Pharmacist

## 2015-11-23 NOTE — Telephone Encounter (Signed)
Called patient to tell her that she needed to be seen in order to refill albuterol.  She is actually now being seen by [facility] and they have been prescribing the albuterol. This was confirmed with Care Everywhere. Will disregard request.

## 2016-01-08 ENCOUNTER — Other Ambulatory Visit: Payer: Self-pay | Admitting: Internal Medicine

## 2016-01-31 ENCOUNTER — Other Ambulatory Visit: Payer: Self-pay | Admitting: Internal Medicine

## 2016-08-07 ENCOUNTER — Emergency Department (HOSPITAL_COMMUNITY)
Admission: EM | Admit: 2016-08-07 | Discharge: 2016-08-07 | Disposition: A | Discharge status: Home / Self Care | Payer: Medicaid Other | Attending: Emergency Medicine | Admitting: Emergency Medicine

## 2016-08-07 ENCOUNTER — Encounter (HOSPITAL_COMMUNITY): Payer: Self-pay

## 2016-08-07 DIAGNOSIS — G43009 Migraine without aura, not intractable, without status migrainosus: Secondary | ICD-10-CM | POA: Insufficient documentation

## 2016-08-07 DIAGNOSIS — O99352 Diseases of the nervous system complicating pregnancy, second trimester: Secondary | ICD-10-CM | POA: Diagnosis not present

## 2016-08-07 DIAGNOSIS — J45909 Unspecified asthma, uncomplicated: Secondary | ICD-10-CM | POA: Insufficient documentation

## 2016-08-07 DIAGNOSIS — Z91040 Latex allergy status: Secondary | ICD-10-CM | POA: Insufficient documentation

## 2016-08-07 DIAGNOSIS — Z3A22 22 weeks gestation of pregnancy: Secondary | ICD-10-CM | POA: Insufficient documentation

## 2016-08-07 LAB — POC URINE PREG, ED: Preg Test, Ur: POSITIVE — AB

## 2016-08-07 LAB — I-STAT BETA HCG BLOOD, ED (MC, WL, AP ONLY): I-stat hCG, quantitative: >2000 m[IU]/mL — ABNORMAL HIGH (ref <5)

## 2016-08-07 MED ORDER — SODIUM CHLORIDE 0.9 % IV BOLUS (SEPSIS)
1000.0000 mL | Freq: Once | INTRAVENOUS | Status: AC
  Administered 2016-08-07: 1000 mL via INTRAVENOUS

## 2016-08-07 MED ORDER — ACETAMINOPHEN 500 MG PO TABS
1000.0000 mg | ORAL_TABLET | Freq: Once | ORAL | Status: AC
  Administered 2016-08-07: 1000 mg via ORAL
  Filled 2016-08-07: qty 2

## 2016-08-07 MED ORDER — METOCLOPRAMIDE HCL 5 MG/ML IJ SOLN
10.0000 mg | Freq: Once | INTRAMUSCULAR | Status: AC
  Administered 2016-08-07: 10 mg via INTRAVENOUS
  Filled 2016-08-07: qty 2

## 2016-08-07 NOTE — ED Notes (Signed)
Denies blurry vision at this time. Reports intermittent blurriness since today.

## 2016-08-07 NOTE — ED Triage Notes (Signed)
Pt states that she has had a headache for the past month, today the headache has gotten worse on the left side and states it is making her vision blurry. Pt states also having photophobia and nausea. Neuro intact

## 2016-08-07 NOTE — ED Provider Notes (Signed)
[facility]-EMERGENCY DEPT Provider Note   CSN: [id number] Arrival date & time: 08/07/16  0115  By signing my name below, I, Arianna Nassar, attest that this documentation has been prepared under the direction and in the presence of Nira Conn, MD.  Electronically Signed: Octavia Heir, ED Scribe. 08/07/16. 4:05 AM.    History   Chief Complaint Chief Complaint  Patient presents with  . Migraine    The history is provided by the patient. No language interpreter was used.   HPI Comments: Bonnie Wilson is a 35 y.o. female who presents to the Emergency Department complaining of an intermittent, gradual worsening, moderate left sided migraine onset one month ago. Pt reports associated light-headedness, photophobia, and blurry vision. She notes this current episode started about 7 hours ago. Pt states her migraines occur every day and lasts for hours at a time. Per spouse, ~ two weeks ago, pt became light-headed in the grocery store and lost consciousness. She denies phonophobia, fever, unilateral weakness, rhinorrhea, congestion, cough, chest pain, shortness of breath, abdominal pain. She further denies family hx of glaucoma.  Pt further expresses that she has been bleeding for two weeks and she is unsure if she is pregnant or not.   Past Medical History:  Diagnosis Date  . Asthma   . Fibromyalgia     Patient Active Problem List   Diagnosis Date Noted  . Vitamin D deficiency 11/17/2014  . Unspecified asthma(493.90) 06/09/2014    History reviewed. No pertinent surgical history.  OB History    No data available       Home Medications    Prior to Admission medications   Medication Sig Start Date End Date Taking? Authorizing Provider  albuterol (PROVENTIL HFA) 108 (90 BASE) MCG/ACT inhaler INHALE 2 PUFFS INTO THE LUNGS EVERY 6 (SIX) HOURS AS NEEDED FOR WHEEZING OR SHORTNESS OF BREATH. 10/04/15  Yes Quentin Angst, MD  beclomethasone (QVAR) 80 MCG/ACT inhaler INHALE 2  PUFFS INTO THE LUNGS 2 (TWO) TIMES DAILY. 10/04/15  Yes Quentin Angst, MD  albuterol (PROVENTIL) (2.5 MG/3ML) 0.083% nebulizer solution USE 1 VIAL IN NEBULIZER EVERY 4 HOURS AS NEEDED FOR WHEEZING/SHORTNESS OF BREATH Patient not taking: Reported on 08/07/2016 07/16/15   Quentin Angst, MD  norethindrone-ethinyl estradiol (JUNEL FE 1/20) 1-20 MG-MCG tablet Take 1 tablet by mouth daily. Patient not taking: Reported on 08/07/2016 11/17/14   Doris Cheadle, MD  predniSONE (STERAPRED UNI-PAK) 10 MG tablet Take by mouth daily. Day 1: take 6 tabs.  Day 2: 5 tabs  Day 3: 4 tabs  Day 4: 3 tabs  Day 5: 2 tabs  Day 6: 1 tab Patient not taking: Reported on 08/07/2016 12/27/13   Trixie Dredge, PA-C  Vitamin D, Ergocalciferol, (DRISDOL) 50000 UNITS CAPS capsule Take 1 capsule (50,000 Units total) by mouth every 7 (seven) days. Patient not taking: Reported on 08/07/2016 11/17/14   Doris Cheadle, MD    Family History Family History  Problem Relation Age of Onset  . Hypertension Mother   . Asthma Father   . Hypertension Father   . Asthma Paternal Aunt     Social History Social History  Substance Use Topics  . Smoking status: Never Smoker  . Smokeless tobacco: Never Used  . Alcohol use No     Allergies   Sulfa antibiotics; Allegra [fexofenadine hcl]; Claritin [loratadine]; and Latex   Review of Systems Review of Systems  A complete 10 system review of systems was obtained and all systems are negative except  as noted in the HPI and PMH.   Physical Exam Updated Vital Signs BP 134/76 (BP Location: Left Arm)   Pulse 64   Temp 98.1 F (36.7 C) (Oral)   Resp 18   LMP 07/30/2016   SpO2 98%   Physical Exam  Constitutional: She is oriented to person, place, and time. She appears well-developed and well-nourished. No distress.  HENT:  Head: Normocephalic and atraumatic.  Nose: Nose normal.  Eyes: Conjunctivae and EOM are normal. Pupils are equal, round, and reactive to light. Right eye  exhibits no discharge. Left eye exhibits no discharge. No scleral icterus.  Visual fields intact IOP 17 on right, 15 on left  Neck: Normal range of motion. Neck supple.  Cardiovascular: Normal rate and regular rhythm.  Exam reveals no gallop and no friction rub.   No murmur heard. Pulmonary/Chest: Effort normal and breath sounds normal. No stridor. No respiratory distress. She has no rales.  Abdominal: Soft. She exhibits no distension. There is no tenderness.  Musculoskeletal: She exhibits no edema or tenderness.  Neurological: She is alert and oriented to person, place, and time.  Mental Status: Alert and oriented to person, place, and time. Attention and concentration normal. Speech clear. Recent memory is intac  Cranial Nerves  II Visual Fields: Intact to confrontation. Visual fields intact. III, IV, VI: Pupils equal and reactive to light and near. Full eye movement without nystagmus  V Facial Sensation: Normal. No weakness of masticatory muscles  VII: No facial weakness or asymmetry  VIII Auditory Acuity: Grossly normal  IX/X: The uvula is midline; the palate elevates symmetrically  XI: Normal sternocleidomastoid and trapezius strength  XII: The tongue is midline. No atrophy or fasciculations.   Motor System: Muscle Strength: 5/5 and symmetric in the upper and lower extremities. No pronation or drift.  Muscle Tone: Tone and muscle bulk are normal in the upper and lower extremities.   Reflexes: DTRs: 2+ and symmetrical in all four extremities. Plantar responses are flexor bilaterally.  Coordination: Intact finger-to-nose, heel-to-shin, and rapid alternating movements. No tremor.  Sensation: Intact to light touch. Negative Romberg test.  Gait: Routine and tandem gait are normal    Skin: Skin is warm and dry. No rash noted. She is not diaphoretic. No erythema.  Psychiatric: She has a normal mood and affect.  Nursing note and vitals reviewed.    ED Treatments / Results  DIAGNOSTIC  STUDIES: Oxygen Saturation is 98% on RA, normal by my interpretation.  COORDINATION OF CARE:  3:51 AM Discussed treatment plan with pt at bedside and pt agreed to plan.  Labs (all labs ordered are listed, but only abnormal results are displayed) Labs Reviewed  POC URINE PREG, ED - Abnormal; Notable for the following:       Result Value   Preg Test, Ur POSITIVE (*)    All other components within normal limits  I-STAT BETA HCG BLOOD, ED ([facility], [facility], [facility] ONLY) - Abnormal; Notable for the following:    I-stat hCG, quantitative >2,000.0 (*)    All other components within normal limits    EKG  EKG Interpretation None       Radiology No results found.  Procedures Procedures (including critical care time)  Medications Ordered in ED Medications  metoCLOPramide (REGLAN) injection 10 mg (10 mg Intravenous Given 08/07/16 0457)  sodium chloride 0.9 % bolus 1,000 mL (1,000 mLs Intravenous New Bag/Given 08/07/16 0457)  acetaminophen (TYLENOL) tablet 1,000 mg (1,000 mg Oral Given 08/07/16 0458)  Initial Impression / Assessment and Plan / ED Course  I have reviewed the triage vital signs and the nursing notes.  Pertinent labs & imaging results that were available during my care of the patient were reviewed by me and considered in my medical decision making (see chart for details).  Clinical Course    Non focal neuro exam. Normal intraocular pressures. No recent head trauma. No fever. Doubt meningitis. Doubt intracranial bleed. Doubt IIH. No indication for imaging. Will treat with tylenol and reglan.  UPT +. Bhcg> 2000. Bedside ultrasound confirming viable intrauterine pregnancy at at approximately 22 weeks by tibia length.  Headache completely resolved after Tylenol and Reglan. Instructed to stop taking NSAIDs for headaches.   The patient is safe for discharge with strict return precautions. OB follow up.   I personally performed the services described in this documentation, which  was scribed in my presence. The recorded information has been reviewed and is accurate.    Final Clinical Impressions(s) / ED Diagnoses   Final diagnoses:  Migraine without aura and without status migrainosus, not intractable  [redacted] weeks gestation of pregnancy   Disposition: Discharge  Condition: Good  I have discussed the results, Dx and Tx plan with the patient who expressed understanding and agree(s) with the plan. Discharge instructions discussed at great length. The patient was given strict return precautions who verbalized understanding of the instructions. No further questions at time of discharge.    Current Discharge Medication List      Follow Up: Doris Cheadle, MD  Schedule an appointment as soon as possible for a visit  As needed  [facility] [address] [place] [address] [phone] Schedule an appointment as soon as possible for a visit  for prenatal care      Nira Conn, MD 08/07/16 [phone]

## 2016-09-23 ENCOUNTER — Ambulatory Visit (HOSPITAL_COMMUNITY)
Admission: EM | Admit: 2016-09-23 | Discharge: 2016-09-23 | Disposition: A | Discharge status: Home / Self Care | Payer: Medicaid Other | Attending: Emergency Medicine | Admitting: Emergency Medicine

## 2016-09-23 ENCOUNTER — Emergency Department (HOSPITAL_COMMUNITY)
Admission: EM | Admit: 2016-09-23 | Discharge: 2016-09-23 | Disposition: A | Discharge status: Home / Self Care | Payer: Medicaid Other | Attending: Dermatology | Admitting: Dermatology

## 2016-09-23 ENCOUNTER — Encounter (HOSPITAL_COMMUNITY): Payer: Self-pay

## 2016-09-23 ENCOUNTER — Encounter (HOSPITAL_COMMUNITY): Payer: Self-pay | Admitting: *Deleted

## 2016-09-23 DIAGNOSIS — Z5321 Procedure and treatment not carried out due to patient leaving prior to being seen by health care provider: Secondary | ICD-10-CM | POA: Insufficient documentation

## 2016-09-23 DIAGNOSIS — J45909 Unspecified asthma, uncomplicated: Secondary | ICD-10-CM | POA: Insufficient documentation

## 2016-09-23 DIAGNOSIS — J40 Bronchitis, not specified as acute or chronic: Secondary | ICD-10-CM | POA: Diagnosis not present

## 2016-09-23 DIAGNOSIS — R05 Cough: Secondary | ICD-10-CM | POA: Insufficient documentation

## 2016-09-23 MED ORDER — ALBUTEROL SULFATE HFA 108 (90 BASE) MCG/ACT IN AERS: INHALATION_SPRAY | RESPIRATORY_TRACT | 0 refills | Status: DC

## 2016-09-23 MED ORDER — AZITHROMYCIN 250 MG PO TABS: ORAL_TABLET | ORAL | 0 refills | Status: DC

## 2016-09-23 MED ORDER — BECLOMETHASONE DIPROPIONATE 80 MCG/ACT IN AERS: INHALATION_SPRAY | RESPIRATORY_TRACT | 1 refills | Status: DC

## 2016-09-23 MED ORDER — ALBUTEROL SULFATE (2.5 MG/3ML) 0.083% IN NEBU: INHALATION_SOLUTION | RESPIRATORY_TRACT | 0 refills | Status: AC

## 2016-09-23 NOTE — ED Triage Notes (Signed)
Pt presents with 1 week h/o cough that is now productive with yellow phlegm; has been through 5 inhalers without relief and is now out.  Pt's husband has had URI.

## 2016-09-23 NOTE — ED Triage Notes (Signed)
Walked  Out  Of  [facility]  This  Am     Got  Tired  Of  Waiting    Pt  Reports   Cough  And  Congestion     History  Of  Asthma         Symptoms  X  2   Weeks

## 2016-09-23 NOTE — ED Notes (Signed)
Urgent Care called and notified this EMT that the pt left the ED and went to urgent care for treatment

## 2016-09-23 NOTE — Discharge Instructions (Signed)
We're going to treat you for bronchitis just to be safe. Take azithromycin as prescribed. I have refilled your asthma medications. Drink plenty of fluids and get plenty of rest. Use nasal saline spray frequently to help with the congestion. Follow-up as needed.

## 2016-09-23 NOTE — ED Provider Notes (Signed)
[facility]    CSN: [id number] Arrival date & time: 09/23/16  1517     History   Chief Complaint Chief Complaint  Patient presents with  . Cough    HPI Bonnie Wilson is a 35 y.o. female.   HPI  She's a 35 year old woman here for evaluation of cough and asthma. She states for the last 2 weeks she has had nasal congestion, sinus drainage, sore throat, and worsening cough. She reports she is wheezing as well. She has used all of her albuterol, both the inhaler and nebulizer. She is also out of her Qvar. She reports some subjective fevers, but no documented temperature. She also reports nausea, but she is [redacted] weeks pregnant.  Past Medical History:  Diagnosis Date  . Asthma   . Fibromyalgia     Patient Active Problem List   Diagnosis Date Noted  . Vitamin D deficiency 11/17/2014  . Unspecified asthma(493.90) 06/09/2014    History reviewed. No pertinent surgical history.  OB History    Gravida Para Term Preterm AB Living   1             SAB TAB Ectopic Multiple Live Births                   Home Medications    Prior to Admission medications   Medication Sig Start Date End Date Taking? Authorizing Provider  albuterol (PROVENTIL HFA) 108 (90 Base) MCG/ACT inhaler INHALE 2 PUFFS INTO THE LUNGS EVERY 6 (SIX) HOURS AS NEEDED FOR WHEEZING OR SHORTNESS OF BREATH. 09/23/16   Charm RingsErin J Tinnie Kunin, MD  albuterol (PROVENTIL) (2.5 MG/3ML) 0.083% nebulizer solution USE 1 VIAL IN NEBULIZER EVERY 4 HOURS AS NEEDED FOR WHEEZING/SHORTNESS OF BREATH 09/23/16   Charm RingsErin J Annabell Oconnor, MD  azithromycin (ZITHROMAX Z-PAK) 250 MG tablet Take 2 pills today, then 1 pill daily until gone. 09/23/16   Charm RingsErin J Deo Mehringer, MD  beclomethasone (QVAR) 80 MCG/ACT inhaler INHALE 2 PUFFS INTO THE LUNGS 2 (TWO) TIMES DAILY. 09/23/16   Charm RingsErin J Ermagene Saidi, MD  norethindrone-ethinyl estradiol (JUNEL FE 1/20) 1-20 MG-MCG tablet Take 1 tablet by mouth daily. Patient not taking: Reported on 08/07/2016 11/17/14   Doris Cheadleeepak Advani, MD      Family History Family History  Problem Relation Age of Onset  . Hypertension Mother   . Asthma Father   . Hypertension Father   . Asthma Paternal Aunt     Social History Social History  Substance Use Topics  . Smoking status: Never Smoker  . Smokeless tobacco: Never Used  . Alcohol use No     Allergies   Sulfa antibiotics; Allegra [fexofenadine hcl]; Claritin [loratadine]; and Latex   Review of Systems Review of Systems As in history of present illness  Physical Exam Triage Vital Signs ED Triage Vitals  Enc Vitals Group     BP 09/23/16 1550 (!) 114/47     Pulse Rate 09/23/16 1550 90     Resp 09/23/16 1550 22     Temp 09/23/16 1550 97.2 F (36.2 C)     Temp Source 09/23/16 1550 Oral     SpO2 09/23/16 1550 99 %     Weight --      Height --      Head Circumference --      Peak Flow --      Pain Score 09/23/16 1549 3     Pain Loc --      Pain Edu? --      Excl.  in GC? --    No data found.   Updated Vital Signs BP (!) 114/47 (BP Location: Right Arm)   Pulse 90   Temp 97.2 F (36.2 C) (Oral)   Resp 22   LMP 09/16/2016 Comment: [redacted]  weeks  pregnant  SpO2 99%   Visual Acuity Right Eye Distance:   Left Eye Distance:   Bilateral Distance:    Right Eye Near:   Left Eye Near:    Bilateral Near:     Physical Exam  Constitutional: She is oriented to person, place, and time. She appears well-developed and well-nourished. No distress.  HENT:  Mouth/Throat: No oropharyngeal exudate.  Minor oropharyngeal erythema. Nasal mucosa is erythematous and boggy.  Neck: Neck supple.  Cardiovascular: Normal rate, regular rhythm and normal heart sounds.   No murmur heard. Pulmonary/Chest: Effort normal and breath sounds normal. No respiratory distress. She has no wheezes. She has no rales.  Speaking in somewhat shortened sentences  Lymphadenopathy:    She has no cervical adenopathy.  Neurological: She is alert and oriented to person, place, and time.     UC  Treatments / Results  Labs (all labs ordered are listed, but only abnormal results are displayed) Labs Reviewed - No data to display  EKG  EKG Interpretation None       Radiology No results found.  Procedures Procedures (including critical care time)  Medications Ordered in UC Medications - No data to display   Initial Impression / Assessment and Plan / UC Course  I have reviewed the triage vital signs and the nursing notes.  Pertinent labs & imaging results that were available during my care of the patient were reviewed by me and considered in my medical decision making (see chart for details).  Clinical Course     Treatment for bronchitis with azithromycin. I've refilled her asthma medications. Recommended nasal saline spray frequently. Follow-up as needed.  Final Clinical Impressions(s) / UC Diagnoses   Final diagnoses:  Bronchitis    New Prescriptions New Prescriptions   AZITHROMYCIN (ZITHROMAX Z-PAK) 250 MG TABLET    Take 2 pills today, then 1 pill daily until gone.     Charm RingsErin J Edris Schneck, MD 09/23/16 [phone]

## 2016-10-16 ENCOUNTER — Ambulatory Visit (INDEPENDENT_AMBULATORY_CARE_PROVIDER_SITE_OTHER): Payer: Medicaid Other | Admitting: Medical

## 2016-10-16 ENCOUNTER — Encounter: Payer: Self-pay | Admitting: Medical

## 2016-10-16 VITALS — BP 132/57 | HR 78 | Wt 232.8 lb

## 2016-10-16 DIAGNOSIS — O99513 Diseases of the respiratory system complicating pregnancy, third trimester: Secondary | ICD-10-CM

## 2016-10-16 DIAGNOSIS — O0933 Supervision of pregnancy with insufficient antenatal care, third trimester: Secondary | ICD-10-CM

## 2016-10-16 DIAGNOSIS — M797 Fibromyalgia: Secondary | ICD-10-CM | POA: Insufficient documentation

## 2016-10-16 DIAGNOSIS — J4520 Mild intermittent asthma, uncomplicated: Secondary | ICD-10-CM

## 2016-10-16 DIAGNOSIS — Z3482 Encounter for supervision of other normal pregnancy, second trimester: Secondary | ICD-10-CM

## 2016-10-16 DIAGNOSIS — Z3490 Encounter for supervision of normal pregnancy, unspecified, unspecified trimester: Secondary | ICD-10-CM | POA: Insufficient documentation

## 2016-10-16 LAB — POCT URINALYSIS DIP (DEVICE)
Bilirubin Urine: NEGATIVE
Glucose, UA: NEGATIVE mg/dL
Ketones, ur: NEGATIVE mg/dL
Nitrite: NEGATIVE
Protein, ur: NEGATIVE mg/dL
SPECIFIC GRAVITY, URINE: 1.015 (ref 1.005–1.030)
UROBILINOGEN UA: 1 mg/dL (ref 0.0–1.0)
pH: 7 (ref 5.0–8.0)

## 2016-10-16 MED ORDER — PRENATAL VITAMINS 0.8 MG PO TABS: 1.0000 | ORAL_TABLET | Freq: Every day | ORAL | 12 refills | Status: DC

## 2016-10-16 MED ORDER — BECLOMETHASONE DIPROPIONATE 80 MCG/ACT IN AERS: INHALATION_SPRAY | RESPIRATORY_TRACT | 1 refills | Status: DC

## 2016-10-16 MED ORDER — ALBUTEROL SULFATE HFA 108 (90 BASE) MCG/ACT IN AERS
2.0000 | INHALATION_SPRAY | Freq: Four times a day (QID) | RESPIRATORY_TRACT | 2 refills | Status: DC | PRN
Start: 2016-10-16 — End: 2018-12-05

## 2016-10-16 MED ORDER — ALBUTEROL SULFATE HFA 108 (90 BASE) MCG/ACT IN AERS
INHALATION_SPRAY | RESPIRATORY_TRACT | 0 refills | Status: DC
Start: 2016-10-16 — End: 2016-10-16

## 2016-10-16 NOTE — Patient Instructions (Signed)
Introduction Patient Name: ________________________________________________ Patient Due Date: ____________________ What is a fetal movement count? A fetal movement count is the number of times that you feel your baby move during a certain amount of time. This may also be called a fetal kick count. A fetal movement count is recommended for every pregnant woman. You may be asked to start counting fetal movements as early as week 28 of your pregnancy. Pay attention to when your baby is most active. You may notice your baby's sleep and wake cycles. You may also notice things that make your baby move more. You should do a fetal movement count:  When your baby is normally most active.  At the same time each day. A good time to count movements is while you are resting, after having something to eat and drink. How do I count fetal movements? 1. Find a quiet, comfortable area. Sit, or lie down on your side. 2. Write down the date, the start time and stop time, and the number of movements that you felt between those two times. Take this information with you to your health care visits. 3. For 2 hours, count kicks, flutters, swishes, rolls, and jabs. You should feel at least 10 movements during 2 hours. 4. You may stop counting after you have felt 10 movements. 5. If you do not feel 10 movements in 2 hours, have something to eat and drink. Then, keep resting and counting for 1 hour. If you feel at least 4 movements during that hour, you may stop counting. Contact a health care provider if:  You feel fewer than 4 movements in 2 hours.  Your baby is not moving like he or she usually does. Date: ____________ Start time: ____________ Stop time: ____________ Movements: ____________ Date: ____________ Start time: ____________ Stop time: ____________ Movements: ____________ Date: ____________ Start time: ____________ Stop time: ____________ Movements: ____________ Date: ____________ Start time: ____________  Stop time: ____________ Movements: ____________ Date: ____________ Start time: ____________ Stop time: ____________ Movements: ____________ Date: ____________ Start time: ____________ Stop time: ____________ Movements: ____________ Date: ____________ Start time: ____________ Stop time: ____________ Movements: ____________ Date: ____________ Start time: ____________ Stop time: ____________ Movements: ____________ Date: ____________ Start time: ____________ Stop time: ____________ Movements: ____________ This information is not intended to replace advice given to you by your health care provider. Make sure you discuss any questions you have with your health care provider. Document Released: 11/15/2006 Document Revised: 06/14/2016 Document Reviewed: 11/25/2015 Elsevier Interactive Patient Education  2017 [employer] Braxton Hicks Contractions Contractions of the uterus can occur throughout pregnancy. Contractions are not always a sign that you are in labor.  WHAT ARE BRAXTON HICKS CONTRACTIONS?  Contractions that occur before labor are called Braxton Hicks contractions, or false labor. Toward the end of pregnancy (32-34 weeks), these contractions can develop more often and may become more forceful. This is not true labor because these contractions do not result in opening (dilatation) and thinning of the cervix. They are sometimes difficult to tell apart from true labor because these contractions can be forceful and people have different pain tolerances. You should not feel embarrassed if you go to the hospital with false labor. Sometimes, the only way to tell if you are in true labor is for your health care provider to look for changes in the cervix. If there are no prenatal problems or other health problems associated with the pregnancy, it is completely safe to be sent home with false labor and await the onset of true labor.   HOW CAN YOU TELL THE DIFFERENCE BETWEEN TRUE AND FALSE LABOR? False Labor     The contractions of false labor are usually shorter and not as hard as those of true labor.   The contractions are usually irregular.   The contractions are often felt in the front of the lower abdomen and in the groin.   The contractions may go away when you walk around or change positions while lying down.   The contractions get weaker and are shorter lasting as time goes on.   The contractions do not usually become progressively stronger, regular, and closer together as with true labor.  True Labor   Contractions in true labor last 30-70 seconds, become very regular, usually become more intense, and increase in frequency.   The contractions do not go away with walking.   The discomfort is usually felt in the top of the uterus and spreads to the lower abdomen and low back.   True labor can be determined by your health care provider with an exam. This will show that the cervix is dilating and getting thinner.  WHAT TO REMEMBER  Keep up with your usual exercises and follow other instructions given by your health care provider.   Take medicines as directed by your health care provider.   Keep your regular prenatal appointments.   Eat and drink lightly if you think you are going into labor.   If Braxton Hicks contractions are making you uncomfortable:   Change your position from lying down or resting to walking, or from walking to resting.   Sit and rest in a tub of warm water.   Drink 2-3 glasses of water. Dehydration may cause these contractions.   Do slow and deep breathing several times an hour.  WHEN SHOULD I SEEK IMMEDIATE MEDICAL CARE? Seek immediate medical care if:  Your contractions become stronger, more regular, and closer together.   You have fluid leaking or gushing from your vagina.   You have a fever.   You pass blood-tinged mucus.   You have vaginal bleeding.   You have continuous abdominal pain.   You have low back pain  that you never had before.   You feel your baby's head pushing down and causing pelvic pressure.   Your baby is not moving as much as it used to.  This information is not intended to replace advice given to you by your health care provider. Make sure you discuss any questions you have with your health care provider. Document Released: 10/16/2005 Document Revised: 02/07/2016 Document Reviewed: 07/28/2013 Elsevier Interactive Patient Education  2017 Elsevier Inc.  

## 2016-10-16 NOTE — Progress Notes (Signed)
See other provider note

## 2016-10-16 NOTE — Progress Notes (Signed)
DECLINED FLU NEED REFILL ON INHALER PRENATAL  Pap smear 2017

## 2016-10-16 NOTE — Progress Notes (Signed)
   PRENATAL VISIT NOTE  Subjective:  Bonnie Wilson is a 35 y.o. [id number] at [redacted]w[redacted]d being seen today for initial prenatal care visit.  She is currently monitored for the following issues for this low-risk pregnancy and has Unspecified asthma(493.90); Vitamin D deficiency; Supervision of normal pregnancy in second trimester; and Fibromyalgia on her problem list.   Patient reports no complaints.  Contractions: Not present. Vag. Bleeding: None today, however the patient states that she has continued to have regular periods x 2-3 days throughout the pregnancy, last bleeding 10/11/16  Movement: Present. Denies leaking of fluid.   The following portions of the patient's history were reviewed and updated as appropriate: allergies, current medications, past family history, past medical history, past social history, past surgical history and problem list. Problem list updated.  Objective:   Vitals:   10/16/16 1009  BP: (!) 132/57  Pulse: 78  Weight: 232 lb 12.8 oz (105.6 kg)    Fetal Status: Fetal Heart Rate (bpm): 154 Fundal Height: 29 cm Movement: Present     General:  Alert, oriented and cooperative. Patient is in no acute distress.  Skin: Skin is warm and dry. No rash noted.   Cardiovascular: Normal heart rate noted  Respiratory: Normal respiratory effort, no problems with respiration noted  Abdomen: Soft, gravid, appropriate for gestational age. Pain/Pressure: Present     Pelvic:  Cervical exam deferred        Extremities: Normal range of motion.     Mental Status: Normal mood and affect. Normal behavior. Normal judgment and thought content.   Assessment and Plan:  Pregnancy: G4P0004 at Unknown  1. Late prenatal care affecting pregnancy in third trimester - Hemoglobinopathy Evaluation - Prenatal Profile - Pain Mgmt, Profile 6 Conf w/o mM, U - US MFM OB COMP + 14 WK; scheduled - Patient will schedule for 2 hour GTT prior to next visiting when fasting   2. Encounter for supervision of  other normal pregnancy in second trimester - Prenatal Multivit-Min-Fe-FA (PRENATAL VITAMINS) 0.8 MG tablet; Take 1 tablet by mouth daily.  Dispense: 30 tablet; Refill: 12  3. Fibromyalgia  4. Mild intermittent asthma, unspecified whether complicated - albuterol (PROVENTIL HFA;VENTOLIN HFA) 108 (90 Base) MCG/ACT inhaler; Inhale 2 puffs into the lungs every 6 (six) hours as needed for wheezing or shortness of breath.  Dispense: 1 Inhaler; Refill: 2 - beclomethasone (QVAR) 80 MCG/ACT inhaler; INHALE 2 PUFFS INTO THE LUNGS 2 (TWO) TIMES DAILY.  Dispense: 8.7 g; Refill: 1  Preterm labor symptoms and general obstetric precautions including but not limited to vaginal bleeding, contractions, leaking of fluid and fetal movement were reviewed in detail with the patient. Please refer to After Visit Summary for other counseling recommendations.  Return in about 2 weeks (around 10/30/2016) for LOB (also needs 2 hr GTT before next visit).   Marny LowensteinJulie N Calleigh Lafontant, PA-C

## 2016-10-17 ENCOUNTER — Other Ambulatory Visit: Payer: Self-pay | Admitting: Medical

## 2016-10-17 ENCOUNTER — Ambulatory Visit (HOSPITAL_COMMUNITY)
Admission: RE | Admit: 2016-10-17 | Discharge: 2016-10-17 | Disposition: A | Discharge status: Home / Self Care | Payer: Medicaid Other | Source: Ambulatory Visit | Attending: Medical | Admitting: Medical

## 2016-10-17 DIAGNOSIS — Z3A32 32 weeks gestation of pregnancy: Secondary | ICD-10-CM | POA: Insufficient documentation

## 2016-10-17 DIAGNOSIS — O0933 Supervision of pregnancy with insufficient antenatal care, third trimester: Secondary | ICD-10-CM

## 2016-10-17 DIAGNOSIS — Z3687 Encounter for antenatal screening for uncertain dates: Secondary | ICD-10-CM | POA: Diagnosis not present

## 2016-10-17 DIAGNOSIS — Z363 Encounter for antenatal screening for malformations: Secondary | ICD-10-CM | POA: Insufficient documentation

## 2016-10-17 DIAGNOSIS — Z3490 Encounter for supervision of normal pregnancy, unspecified, unspecified trimester: Secondary | ICD-10-CM

## 2016-10-17 DIAGNOSIS — Z3689 Encounter for other specified antenatal screening: Secondary | ICD-10-CM

## 2016-10-17 LAB — PRENATAL PROFILE (SOLSTAS)
Antibody Screen: NEGATIVE
BASOS PCT: 0 %
Basophils Absolute: 0 cells/uL (ref 0–200)
EOS PCT: 4 %
Eosinophils Absolute: 268 cells/uL (ref 15–500)
HEMATOCRIT: 32.6 % — AB (ref 35.0–45.0)
HEMOGLOBIN: 10.6 g/dL — AB (ref 11.7–15.5)
HEP B S AG: NEGATIVE
HIV 1&2 Ab, 4th Generation: NONREACTIVE
LYMPHS ABS: 1474 {cells}/uL (ref 850–3900)
Lymphocytes Relative: 22 %
MCH: 27.3 pg (ref 27.0–33.0)
MCHC: 32.5 g/dL (ref 32.0–36.0)
MCV: 84 fL (ref 80.0–100.0)
MPV: 10.2 fL (ref 7.5–12.5)
Monocytes Absolute: 804 cells/uL (ref 200–950)
Monocytes Relative: 12 %
NEUTROS ABS: 4154 {cells}/uL (ref 1500–7800)
Neutrophils Relative %: 62 %
Platelets: 251 10*3/uL (ref 140–400)
RBC: 3.88 MIL/uL (ref 3.80–5.10)
RDW: 14.9 % (ref 11.0–15.0)
Rh Type: POSITIVE
Rubella: 2.88 Index — ABNORMAL HIGH (ref <0.90)
WBC: 6.7 10*3/uL (ref 3.8–10.8)

## 2016-10-18 LAB — HEMOGLOBINOPATHY EVALUATION
HEMATOCRIT: 32.6 % — AB (ref 35.0–45.0)
HEMOGLOBIN: 10.6 g/dL — AB (ref 11.7–15.5)
HGB A2 QUANT: 2.4 % (ref 1.8–3.5)
Hgb A: 96.6 % (ref >96.0)
Hgb F Quant: <1 % (ref <2.0)
MCH: 27.3 pg (ref 27.0–33.0)
MCV: 84 fL (ref 80.0–100.0)
RBC: 3.88 MIL/uL (ref 3.80–5.10)
RDW: 14.9 % (ref 11.0–15.0)

## 2016-10-19 ENCOUNTER — Encounter: Payer: Self-pay | Admitting: Medical

## 2016-10-19 DIAGNOSIS — F1290 Cannabis use, unspecified, uncomplicated: Secondary | ICD-10-CM | POA: Insufficient documentation

## 2016-10-19 LAB — PAIN MGMT, PROFILE 6 CONF W/O MM, U
6 ACETYLMORPHINE: NEGATIVE ng/mL (ref <10)
AMPHETAMINES: NEGATIVE ng/mL (ref <500)
Alcohol Metabolites: NEGATIVE ng/mL (ref <500)
BARBITURATES: NEGATIVE ng/mL (ref <300)
BENZODIAZEPINES: NEGATIVE ng/mL (ref <100)
CREATININE: 184.9 mg/dL (ref >=20.0)
Cocaine Metabolite: NEGATIVE ng/mL (ref <150)
MARIJUANA METABOLITE: 59 ng/mL — AB (ref <5)
METHADONE METABOLITE: NEGATIVE ng/mL (ref <100)
Marijuana Metabolite: POSITIVE ng/mL — AB (ref <20)
OXYCODONE: NEGATIVE ng/mL (ref <100)
Opiates: NEGATIVE ng/mL (ref <100)
Oxidant: NEGATIVE ug/mL (ref <200)
Phencyclidine: NEGATIVE ng/mL (ref <25)
Please note:: 0
pH: 7.29 (ref 4.5–9.0)

## 2016-10-25 ENCOUNTER — Other Ambulatory Visit: Payer: Medicaid Other

## 2016-10-25 DIAGNOSIS — Z3480 Encounter for supervision of other normal pregnancy, unspecified trimester: Secondary | ICD-10-CM

## 2016-10-25 LAB — 2HR GTT W 1 HR, CARPENTER, 75 G
GLUCOSE, 1 HR, GEST: 109 mg/dL (ref <180)
Glucose, 2 Hr, Gest: 89 mg/dL (ref <153)
Glucose, Fasting, Gest: 73 mg/dL (ref 65–91)

## 2016-10-30 NOTE — L&D Delivery Note (Signed)
**Note Bonnie-Identified via Obfuscation** Patient is [age] y.o. [id number] 3115w0d admitted for IOL for postdates.    Delivery Note At 8:20 PM a viable female was delivered via  (Presentation: Occiput Anterior  ).  APGAR: 8,9 ; weight pending.   Placenta status: intact. Cord: 3 vessel  with the following complications: none.   Anesthesia:  None  Lacerations: None Est. Blood Loss (mL): 100  Mom to postpartum.  Baby to Couplet care / Skin to Skin.  Bonnie Wilson 12/17/2016, 8:33 PM

## 2016-11-01 ENCOUNTER — Encounter: Payer: Medicaid Other | Admitting: Advanced Practice Midwife

## 2016-11-08 ENCOUNTER — Ambulatory Visit (INDEPENDENT_AMBULATORY_CARE_PROVIDER_SITE_OTHER): Payer: Medicaid Other | Admitting: Student

## 2016-11-08 VITALS — BP 118/58 | HR 70 | Wt 240.8 lb

## 2016-11-08 DIAGNOSIS — Z3400 Encounter for supervision of normal first pregnancy, unspecified trimester: Secondary | ICD-10-CM

## 2016-11-08 DIAGNOSIS — Z3492 Encounter for supervision of normal pregnancy, unspecified, second trimester: Secondary | ICD-10-CM

## 2016-11-08 DIAGNOSIS — J4520 Mild intermittent asthma, uncomplicated: Secondary | ICD-10-CM

## 2016-11-08 NOTE — Progress Notes (Signed)
F/u OB US scheduled for Monday 11/13/16 @ 0845.  Pt notified.

## 2016-11-08 NOTE — Progress Notes (Signed)
   PRENATAL VISIT NOTE  Subjective:  Bonnie Wilson is a 36 y.o. G4P0004 at 5471w3d being seen today for ongoing prenatal care.  She is currently monitored for the following issues for this low-risk pregnancy and has Unspecified asthma(493.90); Vitamin D deficiency; Supervision of normal pregnancy in second trimester; Fibromyalgia; and Marijuana use on her problem list.  Patient reports no complaints.  Contractions: Not present. Vag. Bleeding: None.  Movement: Present. Denies leaking of fluid.   The following portions of the patient's history were reviewed and updated as appropriate: allergies, current medications, past family history, past medical history, past social history, past surgical history and problem list. Problem list updated.  Objective:   Vitals:   11/08/16 0911  BP: (!) 118/58  Pulse: 70  Weight: 109.2 kg (240 lb 12.8 oz)    Fetal Status:     Movement: Present     General:  Alert, oriented and cooperative. Patient is in no acute distress.  Skin: Skin is warm and dry. No rash noted.   Cardiovascular: Normal heart rate noted  Respiratory: Normal respiratory effort, no problems with respiration noted  Abdomen: Soft, gravid, appropriate for gestational age. Pain/Pressure: Present     Pelvic:  Cervical exam deferred        Extremities: Normal range of motion.  Edema: Trace  Mental Status: Normal mood and affect. Normal behavior. Normal judgment and thought content.   Assessment and Plan:  Pregnancy: G4P0004 at 6671w3d doing well. No complaints.   1. Supervision of normal first pregnancy, antepartum  - US MFM OB FOLLOW UP; Future.  She will get another growth US next week (11-13-2016).  Patient Active Problem List   Diagnosis Date Noted  . Marijuana use 10/19/2016  . Supervision of normal pregnancy in second trimester 10/16/2016  . Fibromyalgia 10/16/2016  . Vitamin D deficiency 11/17/2014  . Unspecified asthma(493.90) 06/09/2014   2. Asthma-Well-controlled. She has  only had to use her inhaler once in the past week due to cold weather. She has a Qvar and Ventolin that she uses.   3. Return in one week for 36 week appointment and cultures.    Term labor symptoms and general obstetric precautions including but not limited to vaginal bleeding, contractions, leaking of fluid and fetal movement were reviewed in detail with the patient. Please refer to After Visit Summary for other counseling recommendations.  Return in about 1 week (around 11/15/2016) for LROB fup.   Marylene LandKathryn Lorraine Nyeisha Goodall, CNM

## 2016-11-08 NOTE — Patient Instructions (Signed)
Introduction Patient Name: ________________________________________________ Patient Due Date: ____________________ What is a fetal movement count? A fetal movement count is the number of times that you feel your baby move during a certain amount of time. This may also be called a fetal kick count. A fetal movement count is recommended for every pregnant woman. You may be asked to start counting fetal movements as early as week 28 of your pregnancy. Pay attention to when your baby is most active. You may notice your baby's sleep and wake cycles. You may also notice things that make your baby move more. You should do a fetal movement count:  When your baby is normally most active.  At the same time each day. A good time to count movements is while you are resting, after having something to eat and drink. How do I count fetal movements? 1. Find a quiet, comfortable area. Sit, or lie down on your side. 2. Write down the date, the start time and stop time, and the number of movements that you felt between those two times. Take this information with you to your health care visits. 3. For 2 hours, count kicks, flutters, swishes, rolls, and jabs. You should feel at least 10 movements during 2 hours. 4. You may stop counting after you have felt 10 movements. 5. If you do not feel 10 movements in 2 hours, have something to eat and drink. Then, keep resting and counting for 1 hour. If you feel at least 4 movements during that hour, you may stop counting. Contact a health care provider if:  You feel fewer than 4 movements in 2 hours.  Your baby is not moving like he or she usually does. Date: ____________ Start time: ____________ Stop time: ____________ Movements: ____________ Date: ____________ Start time: ____________ Stop time: ____________ Movements: ____________ Date: ____________ Start time: ____________ Stop time: ____________ Movements: ____________ Date: ____________ Start time: ____________  Stop time: ____________ Movements: ____________ Date: ____________ Start time: ____________ Stop time: ____________ Movements: ____________ Date: ____________ Start time: ____________ Stop time: ____________ Movements: ____________ Date: ____________ Start time: ____________ Stop time: ____________ Movements: ____________ Date: ____________ Start time: ____________ Stop time: ____________ Movements: ____________ Date: ____________ Start time: ____________ Stop time: ____________ Movements: ____________ This information is not intended to replace advice given to you by your health care provider. Make sure you discuss any questions you have with your health care provider. Document Released: 11/15/2006 Document Revised: 06/14/2016 Document Reviewed: 11/25/2015 Elsevier Interactive Patient Education  2017 [employer] Inc[employer]  

## 2016-11-13 ENCOUNTER — Ambulatory Visit (HOSPITAL_COMMUNITY)
Admission: RE | Admit: 2016-11-13 | Discharge: 2016-11-13 | Disposition: A | Discharge status: Home / Self Care | Payer: Medicaid Other | Source: Ambulatory Visit | Attending: Student | Admitting: Student

## 2016-11-13 ENCOUNTER — Other Ambulatory Visit: Payer: Self-pay | Admitting: Medical

## 2016-11-13 DIAGNOSIS — O0933 Supervision of pregnancy with insufficient antenatal care, third trimester: Secondary | ICD-10-CM | POA: Diagnosis not present

## 2016-11-13 DIAGNOSIS — Z3687 Encounter for antenatal screening for uncertain dates: Secondary | ICD-10-CM | POA: Insufficient documentation

## 2016-11-13 DIAGNOSIS — Z3A36 36 weeks gestation of pregnancy: Secondary | ICD-10-CM | POA: Diagnosis not present

## 2016-11-13 DIAGNOSIS — Z3482 Encounter for supervision of other normal pregnancy, second trimester: Secondary | ICD-10-CM

## 2016-11-13 DIAGNOSIS — Z3400 Encounter for supervision of normal first pregnancy, unspecified trimester: Secondary | ICD-10-CM

## 2016-11-13 DIAGNOSIS — Z363 Encounter for antenatal screening for malformations: Secondary | ICD-10-CM | POA: Insufficient documentation

## 2016-11-15 ENCOUNTER — Encounter: Payer: Medicaid Other | Admitting: Advanced Practice Midwife

## 2016-11-22 ENCOUNTER — Encounter: Payer: Medicaid Other | Admitting: Advanced Practice Midwife

## 2016-11-23 ENCOUNTER — Encounter: Payer: Medicaid Other | Admitting: Advanced Practice Midwife

## 2016-11-28 ENCOUNTER — Ambulatory Visit (INDEPENDENT_AMBULATORY_CARE_PROVIDER_SITE_OTHER): Payer: Medicaid Other | Admitting: Certified Nurse Midwife

## 2016-11-28 ENCOUNTER — Other Ambulatory Visit (HOSPITAL_COMMUNITY)
Admission: RE | Admit: 2016-11-28 | Discharge: 2016-11-28 | Disposition: A | Discharge status: Home / Self Care | Payer: Medicaid Other | Source: Ambulatory Visit | Attending: Advanced Practice Midwife | Admitting: Advanced Practice Midwife

## 2016-11-28 ENCOUNTER — Encounter: Payer: Self-pay | Admitting: Certified Nurse Midwife

## 2016-11-28 VITALS — BP 119/60 | HR 69 | Wt 240.0 lb

## 2016-11-28 DIAGNOSIS — Z113 Encounter for screening for infections with a predominantly sexual mode of transmission: Secondary | ICD-10-CM | POA: Diagnosis not present

## 2016-11-28 DIAGNOSIS — O99323 Drug use complicating pregnancy, third trimester: Secondary | ICD-10-CM

## 2016-11-28 DIAGNOSIS — Z3483 Encounter for supervision of other normal pregnancy, third trimester: Secondary | ICD-10-CM

## 2016-11-28 DIAGNOSIS — F1290 Cannabis use, unspecified, uncomplicated: Secondary | ICD-10-CM

## 2016-11-28 NOTE — Progress Notes (Signed)
Subjective:  Bonnie Wilson is a 36 y.o. G4P3004 at 4880w2d being seen today for ongoing prenatal care.  She is currently monitored for the following issues for this low-risk pregnancy and has Asthma; Vitamin D deficiency; Supervision of normal pregnancy; Fibromyalgia; and Marijuana use on her problem list.  Patient reports no complaints.  Contractions: Irregular. Vag. Bleeding: None.  Movement: Present. Denies leaking of fluid.   The following portions of the patient's history were reviewed and updated as appropriate: allergies, current medications, past family history, past medical history, past social history, past surgical history and problem list. Problem list updated.  Objective:   Vitals:   11/28/16 0820  BP: 119/60  Pulse: 69  Weight: 240 lb (108.9 kg)    Fetal Status: Fetal Heart Rate (bpm): 150 Fundal Height: 39 cm Movement: Present  Presentation: Vertex  General:  Alert, oriented and cooperative. Patient is in no acute distress.  Skin: Skin is warm and dry. No rash noted.   Cardiovascular: Normal heart rate noted  Respiratory: Normal respiratory effort, no problems with respiration noted  Abdomen: Soft, gravid, appropriate for gestational age. Pain/Pressure: Present     Pelvic: Vag. Bleeding: None     Cervical exam performed Dilation: Closed Effacement (%): 50    Extremities: Normal range of motion.  Edema: None  Mental Status: Normal mood and affect. Normal behavior. Normal judgment and thought content.   Urinalysis:      Assessment and Plan:  Pregnancy: G4P3004 at 7380w2d  1. Encounter for supervision of other normal pregnancy in third trimester - Culture, beta strep (group b only) - GC/Chlamydia probe amp ([place]  2. Marijuana use - reports last use October but UDS in December positive, denies current use  Term labor symptoms and general obstetric precautions including but not limited to vaginal bleeding, contractions, leaking of fluid and fetal  movement were reviewed in detail with the patient. Please refer to After Visit Summary for other counseling recommendations.  Return in about 1 week (around 12/05/2016).   Donette LarryMelanie Chaley Castellanos, CNM

## 2016-11-29 LAB — GC/CHLAMYDIA PROBE AMP (~~LOC~~) NOT AT ARMC
Chlamydia: NEGATIVE
Neisseria Gonorrhea: NEGATIVE

## 2016-11-30 ENCOUNTER — Encounter: Payer: Self-pay | Admitting: Certified Nurse Midwife

## 2016-11-30 DIAGNOSIS — B951 Streptococcus, group B, as the cause of diseases classified elsewhere: Secondary | ICD-10-CM | POA: Insufficient documentation

## 2016-11-30 LAB — CULTURE, BETA STREP (GROUP B ONLY)

## 2016-12-05 ENCOUNTER — Ambulatory Visit (INDEPENDENT_AMBULATORY_CARE_PROVIDER_SITE_OTHER): Payer: Medicaid Other | Admitting: Medical

## 2016-12-05 VITALS — BP 101/67 | HR 71 | Wt 235.8 lb

## 2016-12-05 DIAGNOSIS — Z3483 Encounter for supervision of other normal pregnancy, third trimester: Secondary | ICD-10-CM

## 2016-12-05 NOTE — Patient Instructions (Signed)
Introduction Patient Name: ________________________________________________ Patient Due Date: ____________________ What is a fetal movement count? A fetal movement count is the number of times that you feel your baby move during a certain amount of time. This may also be called a fetal kick count. A fetal movement count is recommended for every pregnant woman. You may be asked to start counting fetal movements as early as week 28 of your pregnancy. Pay attention to when your baby is most active. You may notice your baby's sleep and wake cycles. You may also notice things that make your baby move more. You should do a fetal movement count:  When your baby is normally most active.  At the same time each day. A good time to count movements is while you are resting, after having something to eat and drink. How do I count fetal movements? 1. Find a quiet, comfortable area. Sit, or lie down on your side. 2. Write down the date, the start time and stop time, and the number of movements that you felt between those two times. Take this information with you to your health care visits. 3. For 2 hours, count kicks, flutters, swishes, rolls, and jabs. You should feel at least 10 movements during 2 hours. 4. You may stop counting after you have felt 10 movements. 5. If you do not feel 10 movements in 2 hours, have something to eat and drink. Then, keep resting and counting for 1 hour. If you feel at least 4 movements during that hour, you may stop counting. Contact a health care provider if:  You feel fewer than 4 movements in 2 hours.  Your baby is not moving like he or she usually does. Date: ____________ Start time: ____________ Stop time: ____________ Movements: ____________ Date: ____________ Start time: ____________ Stop time: ____________ Movements: ____________ Date: ____________ Start time: ____________ Stop time: ____________ Movements: ____________ Date: ____________ Start time: ____________  Stop time: ____________ Movements: ____________ Date: ____________ Start time: ____________ Stop time: ____________ Movements: ____________ Date: ____________ Start time: ____________ Stop time: ____________ Movements: ____________ Date: ____________ Start time: ____________ Stop time: ____________ Movements: ____________ Date: ____________ Start time: ____________ Stop time: ____________ Movements: ____________ Date: ____________ Start time: ____________ Stop time: ____________ Movements: ____________ This information is not intended to replace advice given to you by your health care provider. Make sure you discuss any questions you have with your health care provider. Document Released: 11/15/2006 Document Revised: 06/14/2016 Document Reviewed: 11/25/2015 Elsevier Interactive Patient Education  2017 [employer] Braxton Hicks Contractions Contractions of the uterus can occur throughout pregnancy. Contractions are not always a sign that you are in labor.  WHAT ARE BRAXTON HICKS CONTRACTIONS?  Contractions that occur before labor are called Braxton Hicks contractions, or false labor. Toward the end of pregnancy (32-34 weeks), these contractions can develop more often and may become more forceful. This is not true labor because these contractions do not result in opening (dilatation) and thinning of the cervix. They are sometimes difficult to tell apart from true labor because these contractions can be forceful and people have different pain tolerances. You should not feel embarrassed if you go to the hospital with false labor. Sometimes, the only way to tell if you are in true labor is for your health care provider to look for changes in the cervix. If there are no prenatal problems or other health problems associated with the pregnancy, it is completely safe to be sent home with false labor and await the onset of true labor.   HOW CAN YOU TELL THE DIFFERENCE BETWEEN TRUE AND FALSE LABOR? False Labor     The contractions of false labor are usually shorter and not as hard as those of true labor.   The contractions are usually irregular.   The contractions are often felt in the front of the lower abdomen and in the groin.   The contractions may go away when you walk around or change positions while lying down.   The contractions get weaker and are shorter lasting as time goes on.   The contractions do not usually become progressively stronger, regular, and closer together as with true labor.  True Labor   Contractions in true labor last 30-70 seconds, become very regular, usually become more intense, and increase in frequency.   The contractions do not go away with walking.   The discomfort is usually felt in the top of the uterus and spreads to the lower abdomen and low back.   True labor can be determined by your health care provider with an exam. This will show that the cervix is dilating and getting thinner.  WHAT TO REMEMBER  Keep up with your usual exercises and follow other instructions given by your health care provider.   Take medicines as directed by your health care provider.   Keep your regular prenatal appointments.   Eat and drink lightly if you think you are going into labor.   If Braxton Hicks contractions are making you uncomfortable:   Change your position from lying down or resting to walking, or from walking to resting.   Sit and rest in a tub of warm water.   Drink 2-3 glasses of water. Dehydration may cause these contractions.   Do slow and deep breathing several times an hour.  WHEN SHOULD I SEEK IMMEDIATE MEDICAL CARE? Seek immediate medical care if:  Your contractions become stronger, more regular, and closer together.   You have fluid leaking or gushing from your vagina.   You have a fever.   You pass blood-tinged mucus.   You have vaginal bleeding.   You have continuous abdominal pain.   You have low back pain  that you never had before.   You feel your baby's head pushing down and causing pelvic pressure.   Your baby is not moving as much as it used to.  This information is not intended to replace advice given to you by your health care provider. Make sure you discuss any questions you have with your health care provider. Document Released: 10/16/2005 Document Revised: 02/07/2016 Document Reviewed: 07/28/2013 Elsevier Interactive Patient Education  2017 Elsevier Inc.  

## 2016-12-05 NOTE — Progress Notes (Signed)
Patient left prior to being seen by the provider. Attempted to contact patient to return for visit, front office staff left message.   Marny LowensteinJulie N Yakub Lodes, PA-C 12/05/2016 8:58 AM

## 2016-12-06 ENCOUNTER — Ambulatory Visit (INDEPENDENT_AMBULATORY_CARE_PROVIDER_SITE_OTHER): Payer: Medicaid Other | Admitting: Advanced Practice Midwife

## 2016-12-06 ENCOUNTER — Other Ambulatory Visit: Payer: Self-pay | Admitting: Medical

## 2016-12-06 VITALS — BP 120/60 | HR 70 | Wt 234.4 lb

## 2016-12-06 DIAGNOSIS — Z3482 Encounter for supervision of other normal pregnancy, second trimester: Secondary | ICD-10-CM

## 2016-12-06 DIAGNOSIS — Z3483 Encounter for supervision of other normal pregnancy, third trimester: Secondary | ICD-10-CM

## 2016-12-06 DIAGNOSIS — J4520 Mild intermittent asthma, uncomplicated: Secondary | ICD-10-CM

## 2016-12-06 NOTE — Patient Instructions (Signed)

## 2016-12-06 NOTE — Progress Notes (Signed)
   PRENATAL VISIT NOTE  Subjective:  Bonnie Wilson is a 36 y.o. G4P3004 at 7999w3d being seen today for ongoing prenatal care.  She is currently monitored for the following issues for this low-risk pregnancy and has Asthma; Vitamin D deficiency; Supervision of normal pregnancy; Fibromyalgia; Marijuana use; and Group beta Strep positive on her problem list.  Patient reports no complaints.  Contractions: Irregular. Vag. Bleeding: None.  Movement: Present. Denies leaking of fluid.   The following portions of the patient's history were reviewed and updated as appropriate: allergies, current medications, past family history, past medical history, past social history, past surgical history and problem list. Problem list updated.  Objective:   Vitals:   12/06/16 1311  BP: 120/60  Pulse: 70  Weight: 234 lb 6.4 oz (106.3 kg)    Fetal Status: Fetal Heart Rate (bpm): 130   Movement: Present     General:  Alert, oriented and cooperative. Patient is in no acute distress.  Skin: Skin is warm and dry. No rash noted.   Cardiovascular: Normal heart rate noted  Respiratory: Normal respiratory effort, no problems with respiration noted  Abdomen: Soft, gravid, appropriate for gestational age. Pain/Pressure: Absent     Pelvic:  Cervical exam deferred        Extremities: Normal range of motion.  Edema: Trace  Mental Status: Normal mood and affect. Normal behavior. Normal judgment and thought content.   Assessment and Plan:  Pregnancy: [id number] at 7699w3d  1. Encounter for supervision of other normal pregnancy in third trimester      Vertex engaged per Le[place]       Reviewed routines for labor and delivery  Term labor symptoms and general obstetric precautions including but not limited to vaginal bleeding, contractions, leaking of fluid and fetal movement were reviewed in detail with the patient. Please refer to After Visit Summary for other counseling recommendations.  Return in about 1 week (around  12/13/2016) for [facility].   Aviva SignsMarie L Williams, CNM

## 2016-12-12 ENCOUNTER — Ambulatory Visit (INDEPENDENT_AMBULATORY_CARE_PROVIDER_SITE_OTHER): Payer: Medicaid Other | Admitting: Certified Nurse Midwife

## 2016-12-12 VITALS — BP 120/62 | HR 75 | Wt 239.0 lb

## 2016-12-12 DIAGNOSIS — Z3483 Encounter for supervision of other normal pregnancy, third trimester: Secondary | ICD-10-CM

## 2016-12-12 DIAGNOSIS — B951 Streptococcus, group B, as the cause of diseases classified elsewhere: Secondary | ICD-10-CM

## 2016-12-12 NOTE — Progress Notes (Signed)
Subjective:  Bonnie Wilson is a 36 y.o. G4P3004 at 6044w2d being seen today for ongoing prenatal care.  She is currently monitored for the following issues for this low-risk pregnancy and has Asthma; Vitamin D deficiency; Supervision of normal pregnancy; Fibromyalgia; Marijuana use; and Group beta Strep positive on her problem list.  Patient reports no complaints.  Contractions: Irregular. Vag. Bleeding: None.  Movement: Present. Denies leaking of fluid.   The following portions of the patient's history were reviewed and updated as appropriate: allergies, current medications, past family history, past medical history, past social history, past surgical history and problem list. Problem list updated.  Objective:   Vitals:   12/12/16 0902  BP: 120/62  Pulse: 75  Weight: 239 lb (108.4 kg)    Fetal Status: Fetal Heart Rate (bpm): 130 Fundal Height: 38 cm Movement: Present  Presentation: Vertex  General:  Alert, oriented and cooperative. Patient is in no acute distress.  Skin: Skin is warm and dry. No rash noted.   Cardiovascular: Normal heart rate noted  Respiratory: Normal respiratory effort, no problems with respiration noted  Abdomen: Soft, gravid, appropriate for gestational age. Pain/Pressure: Present     Pelvic: Vag. Bleeding: None     Cervical exam deferred        Extremities: Normal range of motion.  Edema: Trace  Mental Status: Normal mood and affect. Normal behavior. Normal judgment and thought content.   Urinalysis:      Assessment and Plan:  Pregnancy: [id number] at 4244w2d  1. Encounter for supervision of other normal pregnancy in third trimester - NST/AFI end of week, IOL next week  2. Group beta Strep positive -abx intrapartum  Term labor symptoms and general obstetric precautions including but not limited to vaginal bleeding, contractions, leaking of fluid and fetal movement were reviewed in detail with the patient. Please refer to After Visit Summary for other  counseling recommendations.  Return in about 2 days (around 12/14/2016).   Bonnie Wilson Date, CNM

## 2016-12-15 ENCOUNTER — Ambulatory Visit (INDEPENDENT_AMBULATORY_CARE_PROVIDER_SITE_OTHER): Payer: Medicaid Other | Admitting: Obstetrics & Gynecology

## 2016-12-15 ENCOUNTER — Encounter (HOSPITAL_COMMUNITY): Payer: Self-pay | Admitting: *Deleted

## 2016-12-15 ENCOUNTER — Ambulatory Visit: Payer: Self-pay

## 2016-12-15 ENCOUNTER — Telehealth (HOSPITAL_COMMUNITY): Payer: Self-pay | Admitting: *Deleted

## 2016-12-15 VITALS — BP 108/60 | HR 70

## 2016-12-15 DIAGNOSIS — O480 Post-term pregnancy: Secondary | ICD-10-CM | POA: Diagnosis not present

## 2016-12-15 DIAGNOSIS — Z3689 Encounter for other specified antenatal screening: Secondary | ICD-10-CM | POA: Diagnosis not present

## 2016-12-15 LAB — OB RESULTS CONSOLE GBS: STREP GROUP B AG: POSITIVE

## 2016-12-15 NOTE — Progress Notes (Signed)
Pt informed that the ultrasound is considered a limited OB ultrasound and is not intended to be a complete ultrasound exam.  Patient also informed that the ultrasound is not being completed with the intent of assessing for fetal or placental anomalies or any pelvic abnormalities.  Explained that the purpose of today's ultrasound is to assess for presentation and amniotic fluid volume.  Patient acknowledges the purpose of the exam and the limitations of the study.    IOL scheduled 2/18 @ 0700

## 2016-12-15 NOTE — Telephone Encounter (Signed)
Preadmission screen  

## 2016-12-16 ENCOUNTER — Other Ambulatory Visit: Payer: Self-pay | Admitting: Advanced Practice Midwife

## 2016-12-17 ENCOUNTER — Inpatient Hospital Stay (HOSPITAL_COMMUNITY)
Admission: RE | Admit: 2016-12-17 | Discharge: 2016-12-19 | DRG: 775 | Disposition: A | Discharge status: Home / Self Care | Payer: Medicaid Other | Source: Ambulatory Visit | Attending: Obstetrics & Gynecology | Admitting: Obstetrics & Gynecology

## 2016-12-17 ENCOUNTER — Encounter (HOSPITAL_COMMUNITY): Payer: Self-pay

## 2016-12-17 DIAGNOSIS — O480 Post-term pregnancy: Principal | ICD-10-CM | POA: Diagnosis present

## 2016-12-17 DIAGNOSIS — J45909 Unspecified asthma, uncomplicated: Secondary | ICD-10-CM | POA: Diagnosis present

## 2016-12-17 DIAGNOSIS — M797 Fibromyalgia: Secondary | ICD-10-CM | POA: Diagnosis present

## 2016-12-17 DIAGNOSIS — B951 Streptococcus, group B, as the cause of diseases classified elsewhere: Secondary | ICD-10-CM

## 2016-12-17 DIAGNOSIS — Z3483 Encounter for supervision of other normal pregnancy, third trimester: Secondary | ICD-10-CM

## 2016-12-17 DIAGNOSIS — Z3A41 41 weeks gestation of pregnancy: Secondary | ICD-10-CM | POA: Diagnosis not present

## 2016-12-17 DIAGNOSIS — O99324 Drug use complicating childbirth: Secondary | ICD-10-CM | POA: Diagnosis present

## 2016-12-17 DIAGNOSIS — F1290 Cannabis use, unspecified, uncomplicated: Secondary | ICD-10-CM | POA: Diagnosis present

## 2016-12-17 DIAGNOSIS — O9952 Diseases of the respiratory system complicating childbirth: Secondary | ICD-10-CM | POA: Diagnosis present

## 2016-12-17 DIAGNOSIS — O99824 Streptococcus B carrier state complicating childbirth: Secondary | ICD-10-CM | POA: Diagnosis present

## 2016-12-17 DIAGNOSIS — Z8249 Family history of ischemic heart disease and other diseases of the circulatory system: Secondary | ICD-10-CM

## 2016-12-17 DIAGNOSIS — E559 Vitamin D deficiency, unspecified: Secondary | ICD-10-CM | POA: Diagnosis present

## 2016-12-17 DIAGNOSIS — Z3490 Encounter for supervision of normal pregnancy, unspecified, unspecified trimester: Secondary | ICD-10-CM

## 2016-12-17 LAB — CBC
HEMATOCRIT: 31.6 % — AB (ref 36.0–46.0)
HEMOGLOBIN: 10.7 g/dL — AB (ref 12.0–15.0)
MCH: 27.2 pg (ref 26.0–34.0)
MCHC: 33.9 g/dL (ref 30.0–36.0)
MCV: 80.4 fL (ref 78.0–100.0)
Platelets: 200 10*3/uL (ref 150–400)
RBC: 3.93 MIL/uL (ref 3.87–5.11)
RDW: 14.6 % (ref 11.5–15.5)
WBC: 5.7 10*3/uL (ref 4.0–10.5)

## 2016-12-17 LAB — TYPE AND SCREEN
ABO/RH(D): O POS
ANTIBODY SCREEN: NEGATIVE

## 2016-12-17 LAB — ABO/RH: ABO/RH(D): O POS

## 2016-12-17 MED ORDER — OXYTOCIN 40 UNITS IN LACTATED RINGERS INFUSION - SIMPLE MED: 2.5000 [IU]/h | INTRAVENOUS | Status: DC

## 2016-12-17 MED ORDER — ONDANSETRON HCL 4 MG/2ML IJ SOLN: 4.0000 mg | Freq: Four times a day (QID) | INTRAMUSCULAR | Status: DC | PRN

## 2016-12-17 MED ORDER — LACTATED RINGERS IV SOLN: 500.0000 mL | INTRAVENOUS | Status: DC | PRN

## 2016-12-17 MED ORDER — TERBUTALINE SULFATE 1 MG/ML IJ SOLN
0.2500 mg | Freq: Once | INTRAMUSCULAR | Status: DC | PRN
  Filled 2016-12-17: qty 1

## 2016-12-17 MED ORDER — OXYCODONE-ACETAMINOPHEN 5-325 MG PO TABS: 2.0000 | ORAL_TABLET | ORAL | Status: DC | PRN

## 2016-12-17 MED ORDER — MISOPROSTOL 200 MCG PO TABS
50.0000 ug | ORAL_TABLET | ORAL | Status: DC | PRN
  Administered 2016-12-17 (×2): 50 ug via ORAL
  Filled 2016-12-17 (×2): qty 0.5

## 2016-12-17 MED ORDER — SOD CITRATE-CITRIC ACID 500-334 MG/5ML PO SOLN: 30.0000 mL | ORAL | Status: DC | PRN

## 2016-12-17 MED ORDER — OXYTOCIN BOLUS FROM INFUSION: 500.0000 mL | Freq: Once | INTRAVENOUS | Status: DC

## 2016-12-17 MED ORDER — LACTATED RINGERS IV SOLN: INTRAVENOUS | Status: DC

## 2016-12-17 MED ORDER — FENTANYL CITRATE (PF) 100 MCG/2ML IJ SOLN: 100.0000 ug | INTRAMUSCULAR | Status: DC | PRN

## 2016-12-17 MED ORDER — LACTATED RINGERS IV SOLN
INTRAVENOUS | Status: DC
  Administered 2016-12-17 (×2): via INTRAVENOUS

## 2016-12-17 MED ORDER — OXYCODONE-ACETAMINOPHEN 5-325 MG PO TABS: 1.0000 | ORAL_TABLET | ORAL | Status: DC | PRN

## 2016-12-17 MED ORDER — OXYTOCIN 40 UNITS IN LACTATED RINGERS INFUSION - SIMPLE MED
1.0000 m[IU]/min | INTRAVENOUS | Status: DC
  Administered 2016-12-17: 2 m[IU]/min via INTRAVENOUS
  Filled 2016-12-17: qty 1000

## 2016-12-17 MED ORDER — MISOPROSTOL 25 MCG QUARTER TABLET: 25.0000 ug | ORAL_TABLET | ORAL | Status: DC | PRN

## 2016-12-17 MED ORDER — OXYTOCIN BOLUS FROM INFUSION
500.0000 mL | Freq: Once | INTRAVENOUS | Status: AC
Start: 2016-12-17 — End: 2016-12-17
  Administered 2016-12-17: 500 mL via INTRAVENOUS

## 2016-12-17 MED ORDER — TERBUTALINE SULFATE 1 MG/ML IJ SOLN: 0.2500 mg | Freq: Once | INTRAMUSCULAR | Status: DC | PRN

## 2016-12-17 MED ORDER — FLEET ENEMA 7-19 GM/118ML RE ENEM: 1.0000 | ENEMA | RECTAL | Status: DC | PRN

## 2016-12-17 MED ORDER — LIDOCAINE HCL (PF) 1 % IJ SOLN: 30.0000 mL | INTRAMUSCULAR | Status: DC | PRN

## 2016-12-17 MED ORDER — IBUPROFEN 600 MG PO TABS
600.0000 mg | ORAL_TABLET | Freq: Four times a day (QID) | ORAL | Status: DC
  Administered 2016-12-17 – 2016-12-19 (×7): 600 mg via ORAL
  Filled 2016-12-17 (×7): qty 1

## 2016-12-17 MED ORDER — LIDOCAINE HCL (PF) 1 % IJ SOLN
30.0000 mL | INTRAMUSCULAR | Status: DC | PRN
Start: 2016-12-17 — End: 2016-12-18
  Filled 2016-12-17: qty 30

## 2016-12-17 MED ORDER — PENICILLIN G POTASSIUM 5000000 UNITS IJ SOLR: 5.0000 10*6.[IU] | Freq: Once | INTRAMUSCULAR | Status: DC

## 2016-12-17 MED ORDER — PENICILLIN G POT IN DEXTROSE 60000 UNIT/ML IV SOLN
3.0000 10*6.[IU] | INTRAVENOUS | Status: DC
  Administered 2016-12-17 (×2): 3 10*6.[IU] via INTRAVENOUS
  Filled 2016-12-17 (×7): qty 50

## 2016-12-17 MED ORDER — FENTANYL CITRATE (PF) 100 MCG/2ML IJ SOLN
50.0000 ug | INTRAMUSCULAR | Status: DC | PRN
  Administered 2016-12-17 (×2): 100 ug via INTRAVENOUS
  Filled 2016-12-17 (×2): qty 2

## 2016-12-17 MED ORDER — PENICILLIN G POT IN DEXTROSE 60000 UNIT/ML IV SOLN: 3.0000 10*6.[IU] | INTRAVENOUS | Status: DC

## 2016-12-17 MED ORDER — ACETAMINOPHEN 325 MG PO TABS: 650.0000 mg | ORAL_TABLET | ORAL | Status: DC | PRN

## 2016-12-17 MED ORDER — DEXTROSE 5 % IV SOLN
5.0000 10*6.[IU] | Freq: Once | INTRAVENOUS | Status: AC
  Administered 2016-12-17: 5 10*6.[IU] via INTRAVENOUS
  Filled 2016-12-17: qty 5

## 2016-12-17 NOTE — Progress Notes (Signed)
Bonnie Wilson is a 36 y.o. [id number] at 7270w0d by ultrasound admitted for induction of labor due to Post dates. Due date 12/10/16.  Subjective:   Objective: BP 127/77   Pulse 70   Temp 98 F (36.7 C) (Oral)   Resp 18   Ht 5\' 9"  (1.753 m)   Wt 236 lb (107 kg)   LMP 10/11/2016 (LMP Unknown)   BMI 34.85 kg/m  No intake/output data recorded. No intake/output data recorded.  FHT:  FHR: 130's bpm, variability: moderate,  accelerations:  Present,  decelerations:  Absent UC:   regular, every 2-3 minutes SVE:   Dilation: 1 Effacement (%): Thick Station: -3 Exam by:: Marcelino DusterMichelle, RN   Labs: Lab Results  Component Value Date   WBC 5.7 12/17/2016   HGB 10.7 (L) 12/17/2016   HCT 31.6 (L) 12/17/2016   MCV 80.4 12/17/2016   PLT 200 12/17/2016    Assessment / Plan: Induction of labor due to postterm,  progressing well on pitocin  Labor: Progressing normally Preeclampsia:  no signs or symptoms of toxicity and intake and ouput balanced Fetal Wellbeing:  Category I Pain Control:  IV pain meds I/D:  n/a Anticipated MOD:  NSVD  Wyvonnia DuskyMarie Lawson 12/17/2016, 1:46 PM

## 2016-12-17 NOTE — H&P (Signed)
Bonnie Wilson is a [age] y.A.O1H0865o.G4P3004  female presenting for IOL for post dates. OB History    Gravida Para Term Preterm AB Living   5 3 3  0 1 4   SAB TAB Ectopic Multiple Live Births   1 0 0 1 4     Past Medical History:  Diagnosis Date  . Asthma   . Fibromyalgia    Past Surgical History:  Procedure Laterality Date  . TONSILLECTOMY     at age 89 per pt   Family History: family history includes Asthma in her father and paternal aunt; Hypertension in her father and mother. Social History:  reports that she has never smoked. She has never used smokeless tobacco. She reports that she does not drink alcohol or use drugs.     Maternal Diabetes: No Genetic Screening: Normal Maternal Ultrasounds/Referrals: Normal Fetal Ultrasounds or other Referrals:  None Maternal Substance Abuse:  No Significant Maternal Medications:  None Significant Maternal Lab Results:  Lab values include: Group B Strep positive Other Comments:  None  Review of Systems  Constitutional: Negative.   HENT: Negative.   Eyes: Negative.   Respiratory: Negative.   Cardiovascular: Negative.   Gastrointestinal: Negative.   Genitourinary: Negative.   Musculoskeletal: Negative.   Skin: Negative.   Neurological: Negative.   Endo/Heme/Allergies: Negative.   Psychiatric/Behavioral: Negative.    Maternal Medical History:  Reason for admission: Post dates induction  Fetal activity: Perceived fetal activity is normal.   Last perceived fetal movement was within the past hour.    Prenatal complications: no prenatal complications Prenatal Complications - Diabetes: none.    Dilation: 1 Effacement (%): Thick Station: -3 Exam by:: Marcelino DusterMichelle, RN  Blood pressure 115/78, pulse 81, temperature 97.4 F (36.3 C), temperature source Oral, resp. rate 18, height 5\' 9"  (1.753 m), weight 236 lb (107 kg), last menstrual period 10/11/2016, unknown if currently breastfeeding. Maternal Exam:  Abdomen: Patient reports no abdominal  tenderness. Fetal presentation: vertex  Introitus: Normal vulva. Normal vagina.  Ferning test: not done.  Nitrazine test: not done. Amniotic fluid character: not assessed.  Cervix: Cervix evaluated by digital exam.     Fetal Exam Fetal Monitor Review: Mode: ultrasound.   Variability: moderate (6-25 bpm).   Pattern: accelerations present.    Fetal State Assessment: Category I - tracings are normal.     Physical Exam  Constitutional: She is oriented to person, place, and time. She appears well-developed and well-nourished.  HENT:  Head: Normocephalic.  Eyes: Pupils are equal, round, and reactive to light.  Neck: Normal range of motion.  Cardiovascular: Normal rate, regular rhythm, normal heart sounds and intact distal pulses.   Respiratory: Effort normal and breath sounds normal.  GI: Soft. Bowel sounds are normal.  Genitourinary: Vagina normal and uterus normal.  Musculoskeletal: Normal range of motion.  Neurological: She is alert and oriented to person, place, and time. She has normal reflexes.  Skin: Skin is warm and dry.  Psychiatric: She has a normal mood and affect. Her behavior is normal. Judgment and thought content normal.    Prenatal labs: ABO, Rh: --/--/O POS (02/18 [id number]) Antibody: NEG (02/18 [id number]) Rubella: 2.88 (12/18 1045) RPR: NON REAC (12/18 1045)  HBsAg: NEGATIVE (12/18 1045)  HIV: NONREACTIVE (12/18 1045)  GBS: Positive (02/16 0000)   Assessment/Plan: SVE 1-2/th/post/high cytotec induction of labor GBS pos tx with PCN   Bonnie Wilson 12/17/2016, 10:36 AM

## 2016-12-17 NOTE — Anesthesia Pain Management Evaluation Note (Signed)
  CRNA Pain Management Visit Note  Patient: Bonnie Wilson, 36 y.o., female  "Hello I am a member of the anesthesia team at [facility]. We have an anesthesia team available at all times to provide care throughout the hospital, including epidural management and anesthesia for C-section. I don't know your plan for the delivery whether it a natural birth, water birth, IV sedation, nitrous supplementation, doula or epidural, but we want to meet your pain goals."   1.Was your pain managed to your expectations on prior hospitalizations?   Yes   2.What is your expectation for pain management during this hospitalization?     IV pain meds  3.How can we help you reach that goal? IV pain meds.  Record the patient's initial score and the patient's pain goal.   Pain: 3  Pain Goal: 6 [facility] wants you to be able to say your pain was always managed very well.  Ruven Corradi L 12/17/2016

## 2016-12-18 LAB — RPR: RPR Ser Ql: NONREACTIVE

## 2016-12-18 MED ORDER — WITCH HAZEL-GLYCERIN EX PADS: 1.0000 "application " | MEDICATED_PAD | CUTANEOUS | Status: DC | PRN

## 2016-12-18 MED ORDER — SENNOSIDES-DOCUSATE SODIUM 8.6-50 MG PO TABS: 1.0000 | ORAL_TABLET | Freq: Two times a day (BID) | ORAL | 2 refills | Status: DC

## 2016-12-18 MED ORDER — COCONUT OIL OIL: 1.0000 "application " | TOPICAL_OIL | Status: DC | PRN

## 2016-12-18 MED ORDER — SIMETHICONE 80 MG PO CHEW: 80.0000 mg | CHEWABLE_TABLET | ORAL | Status: DC | PRN

## 2016-12-18 MED ORDER — ONDANSETRON HCL 4 MG/2ML IJ SOLN
4.0000 mg | INTRAMUSCULAR | Status: DC | PRN
Start: 2016-12-18 — End: 2016-12-19

## 2016-12-18 MED ORDER — ACETAMINOPHEN 325 MG PO TABS
650.0000 mg | ORAL_TABLET | ORAL | Status: DC | PRN
  Administered 2016-12-18: 650 mg via ORAL
  Filled 2016-12-18: qty 2

## 2016-12-18 MED ORDER — SENNOSIDES-DOCUSATE SODIUM 8.6-50 MG PO TABS
2.0000 | ORAL_TABLET | ORAL | Status: DC
  Administered 2016-12-18 (×2): 2 via ORAL
  Filled 2016-12-18 (×2): qty 2

## 2016-12-18 MED ORDER — DIBUCAINE 1 % RE OINT: 1.0000 "application " | TOPICAL_OINTMENT | RECTAL | Status: DC | PRN

## 2016-12-18 MED ORDER — ONDANSETRON HCL 4 MG PO TABS: 4.0000 mg | ORAL_TABLET | ORAL | Status: DC | PRN

## 2016-12-18 MED ORDER — TETANUS-DIPHTH-ACELL PERTUSSIS 5-2.5-18.5 LF-MCG/0.5 IM SUSP
0.5000 mL | Freq: Once | INTRAMUSCULAR | Status: AC
  Administered 2016-12-18: 0.5 mL via INTRAMUSCULAR
  Filled 2016-12-18: qty 0.5

## 2016-12-18 MED ORDER — PRENATAL MULTIVITAMIN CH
1.0000 | ORAL_TABLET | Freq: Every day | ORAL | Status: DC
  Administered 2016-12-18 – 2016-12-19 (×2): 1 via ORAL
  Filled 2016-12-18 (×2): qty 1

## 2016-12-18 MED ORDER — IBUPROFEN 600 MG PO TABS: 600.0000 mg | ORAL_TABLET | Freq: Four times a day (QID) | ORAL | 2 refills | Status: DC

## 2016-12-18 MED ORDER — OXYCODONE-ACETAMINOPHEN 5-325 MG PO TABS: 1.0000 | ORAL_TABLET | ORAL | 0 refills | Status: DC | PRN

## 2016-12-18 MED ORDER — DIPHENHYDRAMINE HCL 25 MG PO CAPS: 25.0000 mg | ORAL_CAPSULE | Freq: Four times a day (QID) | ORAL | Status: DC | PRN

## 2016-12-18 MED ORDER — PRENATAL MULTIVITAMIN CH: 1.0000 | ORAL_TABLET | Freq: Every day | ORAL | 12 refills | Status: DC

## 2016-12-18 MED ORDER — ZOLPIDEM TARTRATE 5 MG PO TABS: 5.0000 mg | ORAL_TABLET | Freq: Every evening | ORAL | Status: DC | PRN

## 2016-12-18 MED ORDER — BENZOCAINE-MENTHOL 20-0.5 % EX AERO
1.0000 "application " | INHALATION_SPRAY | CUTANEOUS | Status: DC | PRN
  Administered 2016-12-18: 1 via TOPICAL
  Filled 2016-12-18: qty 56

## 2016-12-18 NOTE — Progress Notes (Signed)
UR chart review completed.  

## 2016-12-18 NOTE — Clinical Social Work Maternal (Signed)
   CLINICAL SOCIAL WORK MATERNAL/CHILD NOTE  Patient Details  Name: Bonnie Wilson MRN: [id number] Date of Birth: 12/17/2015  Date:  12/18/2016  Clinical Social Worker Initiating Note:  Laurey Arrow Date/ Time Initiated:  12/18/16/1256     Child's Name:  Bonnie Wilson   Legal Guardian:  Mother   Need for Interpreter:  None   Date of Referral:  12/18/16     Reason for Referral:  Current Substance Use/Substance Use During Pregnancy  (MOB had a positive screen for [facility] on 10/16/2016)   Referral Source:  Central Nursery   Address:  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  MOB was receptive to meeting with CSW.  When CSW arrived, MOB was resting in bed and infant was asleep in bassinet.  FOB Bonnie Wilson) was resting on the couch and communicated to CSW that FOB was recovering from having his spleen removed recently. MOB gave CSW permission to complete the assessment while FOB was present. CSW inquired about MOB's substance use hx, and MOB acknowledged the use of marijuana via edibles during the month of November. MOB stated that MOB's sister came to [place] to visit with MOB from [place] and  brought edible to engage in recreationally. MOB denied having a SA problem and declined resources for SA treatment. CSW informed MOB of the hospital's drug screen policy, and informed MOB of the 2 screenings for the infant. MOB appeared understanding and communicated she was not concerned about the infant having a positive UDS or CDS. CSW shared with MOB that the infant's UDS negative and CSW will continue to monitor the infant's CDS. CSW made MOB aware that if the infant's CDS is positive without an explanation, CSW will make a report to [facility] CPS.  MOB did not have any questions regarding the hospital's policy. CSW educated MOB about PPD. CSW informed MOB of possible supports and interventions to decrease PPD.  CSW also encouraged MOB to seek medical attention if needed for increased signs and symptoms for PPD. CSW reviewed safe sleep, and SIDS. MOB was knowledgeable and asked appropriate questions.  MOB communicated that she has a pack n play for the baby, and feels prepared for the infant.  MOB did not have any further questions, concerns, or needs at this time. CSW thanked MOB for meeting with CSW and provided MOB with CSW contact information.   CSW Plan/Description:  Psychosocial Support and Ongoing Assessment of Needs, No Further Intervention Required/No Barriers to Discharge, Information/Referral to [employer]  (CSW will monitor CDS and will make a report if warranted. )    Fowler Antos D BOYD-GILYARD, LCSW 12/18/2016, 1:00 PM

## 2016-12-18 NOTE — Discharge Summary (Signed)
OB Discharge Summary     Patient Name: Bonnie Wilson DOB: October 23, 1981 MRN: [id number]  Date of admission: 12/17/2016 Delivering MD: Wyvonnia Dusky D   Date of discharge: 12/18/2016  Admitting diagnosis: INDUCTION Intrauterine pregnancy: [redacted]w[redacted]d     Secondary diagnosis:  Active Problems:   Asthma   Vitamin D deficiency   Supervision of normal pregnancy   Fibromyalgia   Marijuana use   Group beta Strep positive   Post term pregnancy at [redacted] weeks gestation   Post term pregnancy  Additional problems: none     Discharge diagnosis: Term Pregnancy Delivered                                                                                                Post partum procedures:none  Augmentation: none  Complications: None  Hospital course:  Onset of Labor With Vaginal Delivery     36 y.o. yo [id number] at [redacted]w[redacted]d was admitted in Active Labor on 12/17/2016. Patient had an uncomplicated labor course as follows:  Membrane Rupture Time/Date: 7:15 PM ,12/17/2016   Intrapartum Procedures: Episiotomy: None [1]                                         Lacerations:  None [1]  Patient had a delivery of a Viable infant. 12/17/2016  Information for the patient's newborn:  Mayeli, Bornhorst [[id number]]  Delivery Method: Vaginal, Spontaneous Delivery (Filed from Delivery Summary)    Pateint had an uncomplicated postpartum course.  She is ambulating, tolerating a regular diet, passing flatus, and urinating well. Patient is discharged home in stable condition on 12/18/16.   Physical exam  Vitals:   12/17/16 2207 12/17/16 2310 12/18/16 0015 12/18/16 0433  BP: (!) 123/57 113/83 117/64 (!) 116/52  Pulse: 64 64 (!) 56 70  Resp:  18 18 20   Temp:  98.4 F (36.9 C) 98.1 F (36.7 C) 98.6 F (37 C)  TempSrc:      Weight:      Height:       General: alert, cooperative and no distress Lochia: appropriate Uterine Fundus: firm Incision: N/A DVT Evaluation: No evidence of DVT seen on physical exam. No  cords or calf tenderness. No significant calf/ankle edema. Labs: Lab Results  Component Value Date   WBC 5.7 12/17/2016   HGB 10.7 (L) 12/17/2016   HCT 31.6 (L) 12/17/2016   MCV 80.4 12/17/2016   PLT 200 12/17/2016   CMP Latest Ref Rng & Units 06/09/2014  Glucose 70 - 99 mg/dL 83  BUN 6 - 23 mg/dL 12  Creatinine 2.44 - 0.10 mg/dL 2.72  Sodium 536 - 644 mEq/L 136  Potassium 3.5 - 5.3 mEq/L 4.7  Chloride 96 - 112 mEq/L 103  CO2 19 - 32 mEq/L 28  Calcium 8.4 - 10.5 mg/dL 9.6  Total Protein 6.0 - 8.3 g/dL 7.2  Total Bilirubin 0.2 - 1.2 mg/dL 0.4  Alkaline Phos 39 - 117 U/L 87  AST 0 - 37 U/L 14  ALT 0 -  35 U/L 14    Discharge instruction: per After Visit Summary and "Baby and Me Booklet".  After visit meds:  Allergies as of 12/18/2016      Reactions   Sulfa Antibiotics Anaphylaxis, Rash   Allegra [fexofenadine Hcl] Hives, Other (See Comments)   Causes hyperactivity    Claritin [loratadine] Hives, Other (See Comments)   Cause hyperactivity   Other    Nuts, limes, uncooked apples, coconut, watermelon, black eyed peas   Latex Hives, Rash      Medication List    TAKE these medications   albuterol (2.5 MG/3ML) 0.083% nebulizer solution Commonly known as:  PROVENTIL USE 1 VIAL IN NEBULIZER EVERY 4 HOURS AS NEEDED FOR WHEEZING/SHORTNESS OF BREATH   albuterol 108 (90 Base) MCG/ACT inhaler Commonly known as:  PROVENTIL HFA;VENTOLIN HFA Inhale 2 puffs into the lungs every 6 (six) hours as needed for wheezing or shortness of breath.   VENTOLIN HFA 108 (90 Base) MCG/ACT inhaler Generic drug:  albuterol INHALE 2 PUFFS INTO THE LUNGS EVERY 6 (SIX) HOURS AS NEEDED FOR WHEEZING OR SHORTNESS OF BREATH.   ibuprofen 600 MG tablet Commonly known as:  ADVIL,MOTRIN Take 1 tablet (600 mg total) by mouth every 6 (six) hours.   oxyCODONE-acetaminophen 5-325 MG tablet Commonly known as:  ROXICET Take 1-2 tablets by mouth every 4 (four) hours as needed.   prenatal multivitamin Tabs  tablet Take 1 tablet by mouth daily at 12 noon.   Prenatal Vitamins 0.8 MG tablet Take 1 tablet by mouth daily.   QVAR 80 MCG/ACT inhaler Generic drug:  beclomethasone INHALE 2 PUFFS INTO THE LUNGS 2 (TWO) TIMES DAILY.   senna-docusate 8.6-50 MG tablet Commonly known as:  Senokot-S Take 1 tablet by mouth 2 (two) times daily.       Diet: routine diet  Activity: Advance as tolerated. Pelvic rest for 6 weeks.   Outpatient follow up:6 weeks Follow up Appt:Future Appointments Date Time Provider Department Center  01/22/2017 10:20 AM Marny LowensteinJulie N Wenzel, PA-C [facility]   Follow up Visit:No Follow-up on file.  Postpartum contraception: Combination OCPs  Newborn Data: Live born female  Birth Weight: 7 lb 13.4 oz (3555 g) APGAR: 8, 9  Baby Feeding: Breast Disposition:home with mother   12/18/2016 Roe Coombsachelle A Giulliana Mcroberts, CNM

## 2016-12-18 NOTE — Plan of Care (Signed)
Problem: Nutritional: Goal: Mothers verbalization of comfort with breastfeeding process will improve Outcome: Completed/Met Date Met: 12/18/16 Pt reports comfort with breastfeeding.  Denies difficulty latching.  Baby latches without difficulty with flanged lips and occasional swallows.  Pt educated on waking techniques for baby.
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
## 2016-12-18 NOTE — Lactation Note (Signed)
This note was copied from a baby's chart. Lactation Consultation Note: Mother has 4 other children, she reports she breastfeed for 2-3 months , then pumped for 3 months with all children.  Mother reports that infant is feeding well. Mother was given lactation Brochure and reviewed basic breastfeeding with mother. Mother knows how  to reach Lactation for any assistance that is needed.  Patient Name: Girl Ulice BrilliantCamille Funes Today's Date: 12/18/2016 Reason for consult: Initial assessment   Maternal Data Has patient been taught Hand Expression?: Yes Does the patient have breastfeeding experience prior to this delivery?: Yes  Feeding Feeding Type: Breast Fed Length of feed: 10 min  LATCH Score/Interventions                      Lactation Tools Discussed/Used     Consult Status Consult Status: Follow-up Date: 12/18/16 Follow-up type: In-patient    Stevan BornKendrick, Wania Longstreth [facility] 12/18/2016, 2:03 PM
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
## 2016-12-19 NOTE — Discharge Summary (Signed)
Her discharge yesterday was delayed due to the fact that the baby could not go home until today. She is having no problems and her physical exam is normal.

## 2016-12-19 NOTE — Lactation Note (Signed)
This note was copied from a baby's chart. Lactation Consultation Note  Patient Name: Bonnie Wilson BrilliantCamille Rokosz [id number]'XToday's Date: 12/19/2016 Reason for consult: Follow-up assessment;Infant weight loss (6% weight loss, LC updated doc flow sheets per mom , serum bili 34 hrs. 5.1 ) Baby is 36 hrs old, breast feeding consistently Latch score range 8-9's.  Voids and stools QS for age. Per mom breast are feeling heavier and more swallows noted. And baby recently breast fed at 0800 for 25 mns. And is presently sleeping in moms arms. Sore nipple and engorgement prevention and tx reviewed. Per mom has a Pump at home.  Mother informed of post-discharge support and given phone number to the lactation department, including services for phone call assistance; out-patient appointments; and breastfeeding support group. List of other breastfeeding resources in the community given in the handout. Encouraged mother to call for problems or concerns related to breastfeeding.  Maternal Data    Feeding Feeding Type: Breast Fed Length of feed: 25 min (per mom )  LATCH Score/Interventions                Intervention(s): Breastfeeding basics reviewed     Lactation Tools Discussed/Used     Consult Status Consult Status: Complete Date: 12/19/16    Kathrin GreathouseMargaret Ann Kimoni Pickerill 12/19/2016, 9:14 AM

## 2016-12-25 NOTE — Progress Notes (Signed)
CSW made a CPS report with [place] CPS, Pam Miller.  Infant had a positive CDS.  CSW also informed pediatrician office ([facility] of infant's positive CDS for THC.  Bonnie Wilson, MSW, LCSW Clinical Social Work [phone]   

## 2017-01-12 ENCOUNTER — Other Ambulatory Visit: Payer: Self-pay | Admitting: Student

## 2017-01-12 DIAGNOSIS — J4520 Mild intermittent asthma, uncomplicated: Secondary | ICD-10-CM

## 2017-01-12 DIAGNOSIS — Z3482 Encounter for supervision of other normal pregnancy, second trimester: Secondary | ICD-10-CM

## 2017-01-12 MED ORDER — BECLOMETHASONE DIPROPIONATE 80 MCG/ACT IN AERS: INHALATION_SPRAY | RESPIRATORY_TRACT | 0 refills | Status: DC

## 2017-01-18 ENCOUNTER — Other Ambulatory Visit: Payer: Self-pay | Admitting: Medical

## 2017-01-22 ENCOUNTER — Other Ambulatory Visit: Payer: Self-pay | Admitting: Student

## 2017-01-22 ENCOUNTER — Encounter: Payer: Self-pay | Admitting: Medical

## 2017-01-22 ENCOUNTER — Ambulatory Visit (INDEPENDENT_AMBULATORY_CARE_PROVIDER_SITE_OTHER): Payer: Medicaid Other | Admitting: Medical

## 2017-01-22 DIAGNOSIS — L299 Pruritus, unspecified: Secondary | ICD-10-CM

## 2017-01-22 DIAGNOSIS — T148XXA Other injury of unspecified body region, initial encounter: Secondary | ICD-10-CM

## 2017-01-22 DIAGNOSIS — J4520 Mild intermittent asthma, uncomplicated: Secondary | ICD-10-CM

## 2017-01-22 DIAGNOSIS — Z3482 Encounter for supervision of other normal pregnancy, second trimester: Secondary | ICD-10-CM

## 2017-01-22 MED ORDER — TRIAMCINOLONE ACETONIDE 0.1 % EX CREA: TOPICAL_CREAM | CUTANEOUS | 0 refills | Status: AC

## 2017-01-22 MED ORDER — ALBUTEROL SULFATE HFA 108 (90 BASE) MCG/ACT IN AERS: INHALATION_SPRAY | RESPIRATORY_TRACT | 0 refills | Status: DC

## 2017-01-22 MED ORDER — BECLOMETHASONE DIPROPIONATE 80 MCG/ACT IN AERS: INHALATION_SPRAY | RESPIRATORY_TRACT | 0 refills | Status: DC

## 2017-01-22 MED ORDER — NORETHINDRONE 0.35 MG PO TABS: 1.0000 | ORAL_TABLET | Freq: Every day | ORAL | 11 refills | Status: AC

## 2017-01-22 NOTE — Progress Notes (Signed)
Subjective:     Bonnie BrilliantCamille Wilson is a 36 y.o. female who presents for a postpartum visit. She is 5 weeks postpartum following a spontaneous vaginal delivery. I have fully reviewed the prenatal and intrapartum course. The delivery was at 41 gestational weeks. Outcome: spontaneous vaginal delivery. Anesthesia: none. Postpartum course has been unremarkable. Baby's course has been unremarkable. Baby is feeding by breast. Bleeding no bleeding. Bowel function is normal. Bladder function is abnormal: stress incontinence. Patient is not sexually active. Contraception method is none. Postpartum depression screening: negative. Patient is having some issues with easy bruising on her lower legs and mild non-pitting edema.   The following portions of the patient's history were reviewed and updated as appropriate: allergies, current medications, past family history, past medical history, past social history, past surgical history and problem list.  Review of Systems Pertinent items are noted in HPI.   Objective:    BP (!) 122/58   Pulse 69   Wt 241 lb (109.3 kg)   Breastfeeding? Yes   BMI 35.59 kg/m   General:  alert and cooperative   Breasts:  not performed  Lungs: clear to auscultation bilaterally  Heart:  regular rate and rhythm, S1, S2 normal, no murmur, click, rub or gallop  Abdomen: soft, non-tender; bowel sounds normal; no masses,  no organomegaly   Vulva:  not evaluated  Vagina: not evaluated  Cervix:  not performed  Corpus: not performed  Adnexa:  not evaluated  Rectal Exam: Lower extremities: Not performed.  Mild, non-pitting edema noted equally bilaterally of the lower legs. Mild sporadic ecchymosis noted. No calf tenderness. Negative Homan's sign.         Assessment:     Normal postpartum exam. Pap smear not done at today's visit.  Easy bruising of lower legs  Plan:    1. Contraception: oral progesterone-only contraceptive 2. CBC today to check Hgb and Plt 3. Warning signs for  worsening condition discussed 4. Rx refills for Ventolin, Qvar and Triamcinolone sent to patient's pharmacy. Advised to follow-up with PCP for future management 5. Follow up as needed.    Marny LowensteinJulie N Matisse Salais, PA-C 01/22/2017 11:07 AM

## 2017-01-22 NOTE — Patient Instructions (Signed)
Laparoscopic Tubal Ligation Laparoscopic tubal ligation is a procedure to close the fallopian tubes. This is done so that you cannot get pregnant. When the fallopian tubes are closed, the eggs that your ovaries release cannot enter the uterus, and sperm cannot reach the released eggs. A laparoscopic tubal ligation is sometimes called "getting your tubes tied." You should not have this procedure if you want to get pregnant someday or if you are unsure about having more children. Tell a health care provider about:  Any allergies you have.  All medicines you are taking, including vitamins, herbs, eye drops, creams, and over-the-counter medicines.  Any problems you or family members have had with anesthetic medicines.  Any blood disorders you have.  Any surgeries you have had.  Any medical conditions you have.  Whether you are pregnant or may be pregnant.  Any past pregnancies. What are the risks? Generally, this is a safe procedure. However, problems may occur, including:  Infection.  Bleeding.  Injury to surrounding organs.  Side effects from anesthetics.  Failure of the procedure. This procedure can increase your risk of a kind of pregnancy in which a fertilized egg attaches to the outside of the uterus (ectopic pregnancy). What happens before the procedure?  Ask your health care provider about:  Changing or stopping your regular medicines. This is especially important if you are taking diabetes medicines or blood thinners.  Taking medicines such as aspirin and ibuprofen. These medicines can thin your blood. Do not take these medicines before your procedure if your health care provider instructs you not to.  Follow instructions from your health care provider about eating and drinking restrictions.  Plan to have someone take you home after the procedure.  If you go home right after the procedure, plan to have someone with you for 24 hours. What happens during the  procedure?  You will be given one or more of the following:  A medicine to help you relax (sedative).  A medicine to numb the area (local anesthetic).  A medicine to make you fall asleep (general anesthetic).  A medicine that is injected into an area of your body to numb everything below the injection site (regional anesthetic).  An IV tube will be inserted into one of your veins. It will be used to give you medicines and fluids during the procedure.  Your bladder may be emptied with a small tube (catheter).  If you have been given a general anesthetic, a tube will be put down your throat to help you breathe.  Two small cuts (incisions) will be made in your lower abdomen and near your belly button.  Your abdomen will be inflated with a gas. This will let the surgeon see better and will give the surgeon room to work.  A thin, lighted tube (laparoscope) with a camera attached will be inserted into your abdomen through one of the incisions. Small instruments will be inserted through the other incision.  The fallopian tubes will be tied off, burned (cauterized), or blocked with a clip, ring, or clamp. A small portion in the center of each fallopian tube may be removed.  The gas will be released from the abdomen.  The incisions will be closed with stitches (sutures).  A bandage (dressing) will be placed over the incisions. The procedure may vary among health care providers and hospitals. What happens after the procedure?  Your blood pressure, heart rate, breathing rate, and blood oxygen level will be monitored often until the medicines you were   given have worn off.  You will be given medicine to help with pain, nausea, and vomiting as needed. This information is not intended to replace advice given to you by your health care provider. Make sure you discuss any questions you have with your health care provider. Document Released: 01/22/2001 Document Revised: 03/23/2016 Document  Reviewed: 09/26/2015 Elsevier Interactive Patient Education  2017 [employer]  

## 2017-01-23 LAB — CBC
HEMATOCRIT: 39.2 % (ref 34.0–46.6)
Hemoglobin: 12.3 g/dL (ref 11.1–15.9)
MCH: 26.5 pg — ABNORMAL LOW (ref 26.6–33.0)
MCHC: 31.4 g/dL — ABNORMAL LOW (ref 31.5–35.7)
MCV: 84 fL (ref 79–97)
Platelets: 305 10*3/uL (ref 150–379)
RBC: 4.65 x10E6/uL (ref 3.77–5.28)
RDW: 14.9 % (ref 12.3–15.4)
WBC: 6.1 10*3/uL (ref 3.4–10.8)

## 2017-10-10 IMAGING — US US MFM OB FOLLOW-UP
1 series · 13 of 13 positions shown · non-contrast
Comparison: none

[Series 1: us mfm ob follow-up · 13 of 13 slices shown]
[im 1/13]
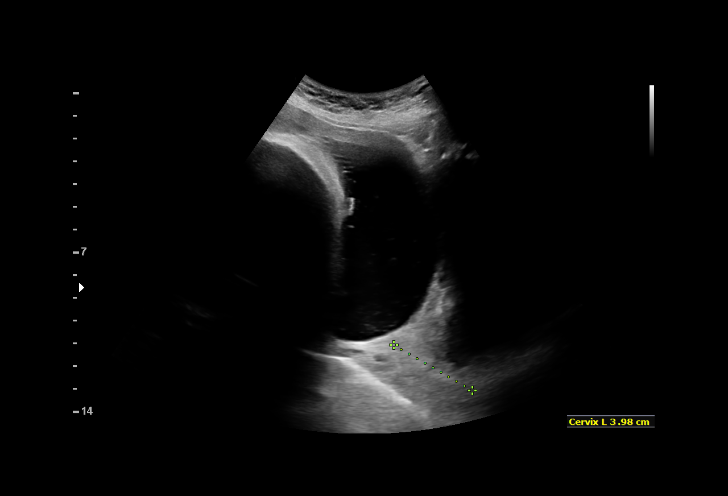
[im 2/13]
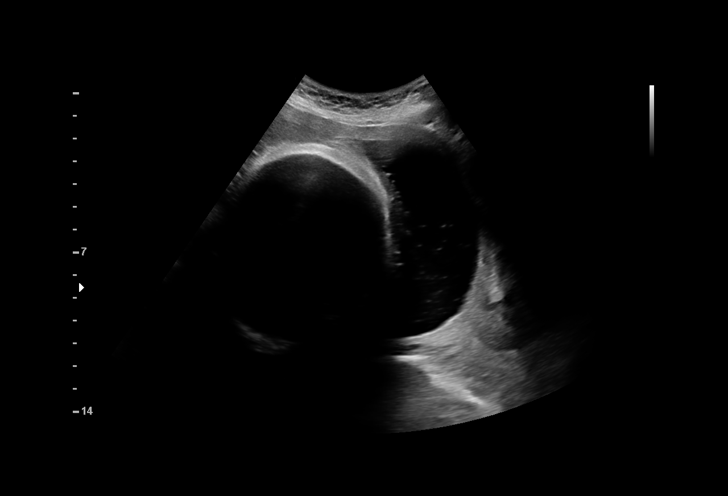
[im 3/13]
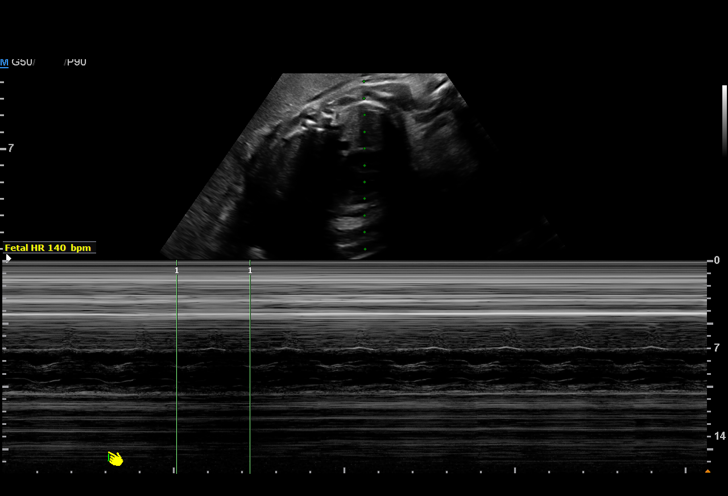
[im 4/13]
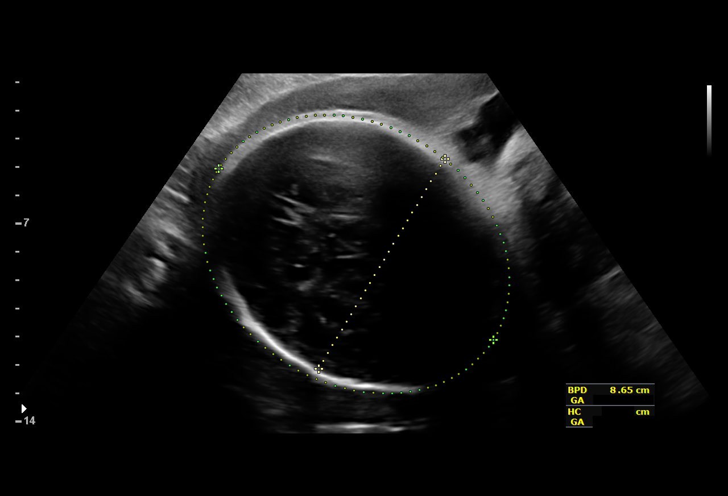
[im 5/13]
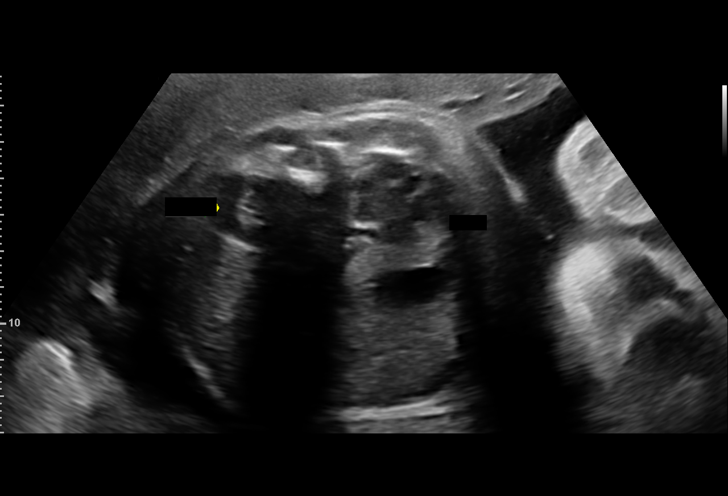
[im 6/13]
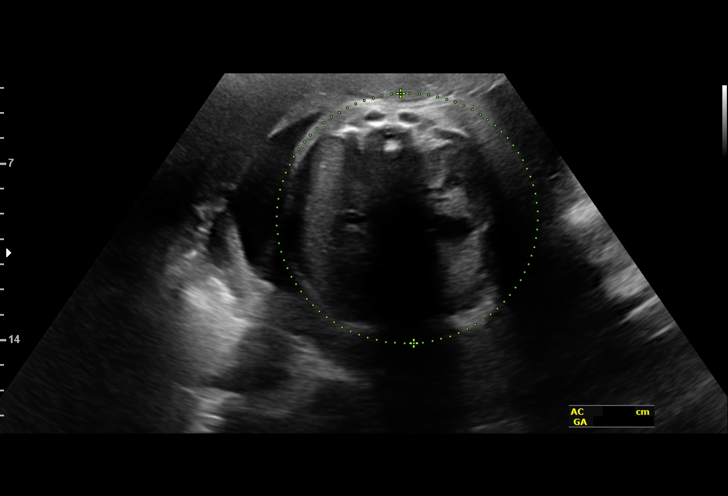
[im 7/13]
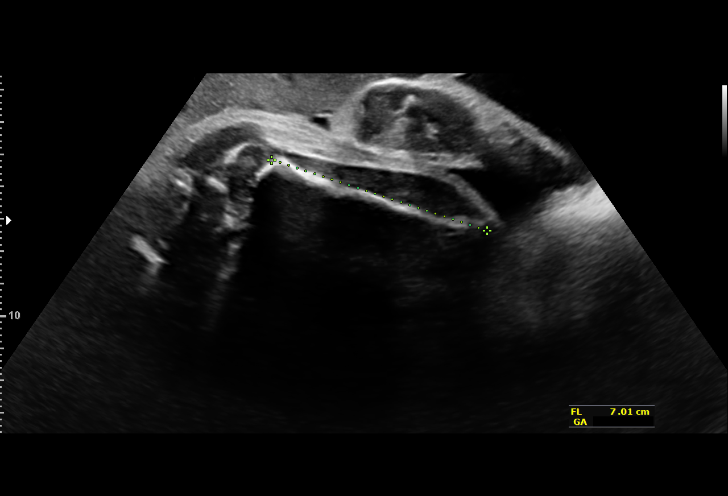
[im 8/13]
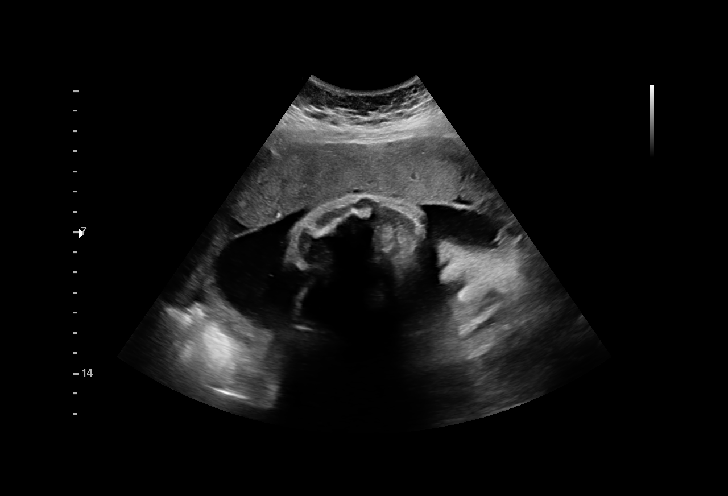
[im 9/13]
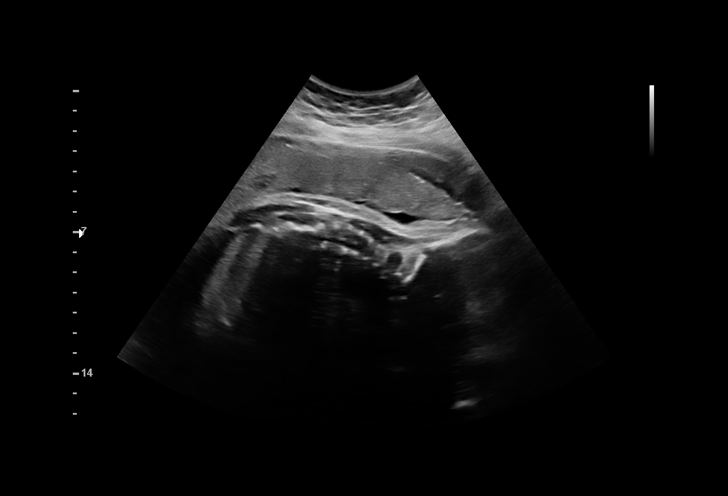
[im 10/13]
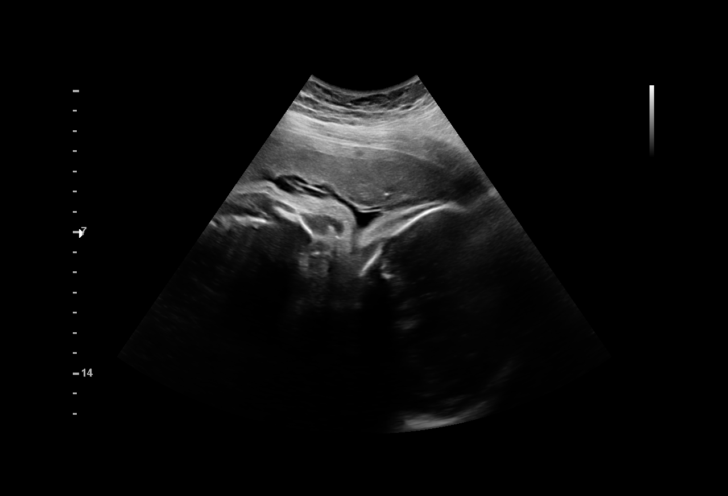
[im 11/13]
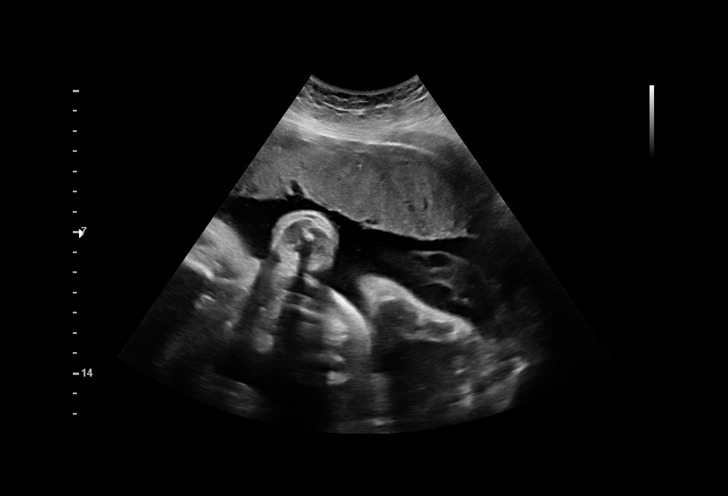
[im 12/13]
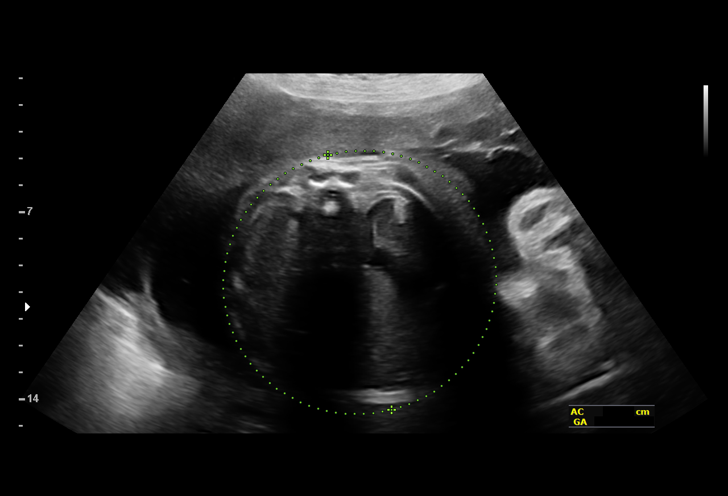
[im 13/13]
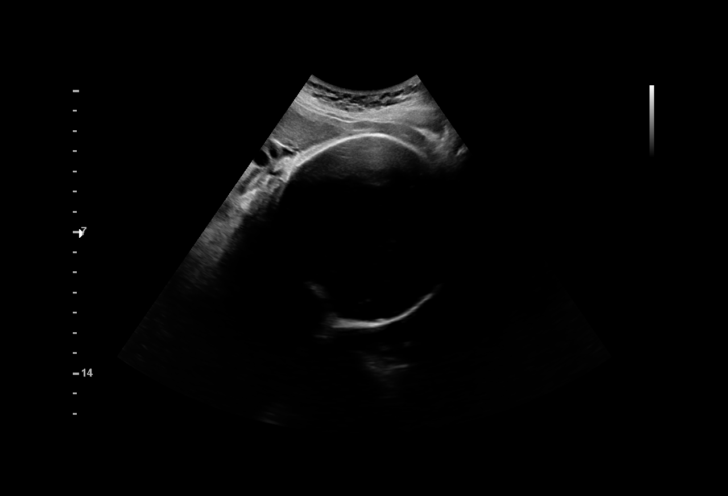

[13 of 13 positions shown; findings below may reference images not displayed]

1  NELSY GADEA         [id number]      [id number]     [id number]
Indications

36 weeks gestation of pregnancy
Encounter for antenatal screening for
malformations
Late to prenatal care, third trimester
Encounter for uncertain dates
OB History

Blood Type:            Height:  5'9"   Weight (lb):  233       BMI:
Gravidity:    5          SAB:   0
TOP:          0       Ectopic:  0        Living: 4
Fetal Evaluation

Num Of Fetuses:     1
Fetal Heart         140
Rate(bpm):
Cardiac Activity:   Observed
Presentation:       Cephalic
Placenta:           Anterior, above cervical os
P. Cord Insertion:  Visualized, central

Amniotic Fluid
AFI FV:      Subjectively within normal limits

AFI Sum(cm)     %Tile       Largest Pocket(cm)
14.98           55

RUQ(cm)       RLQ(cm)       LUQ(cm)        LLQ(cm)
5.21
Biometry
BPD:      86.5  mm     G. Age:  34w 6d         24  %    CI:        71.74   %    70 - 86
FL/HC:      21.6   %    20.1 -
HC:      325.1  mm     G. Age:  36w 6d         37  %    HC/AC:      1.03        0.93 -
AC:      315.9  mm     G. Age:  35w 3d         43  %    FL/BPD:     81.0   %    71 - 87
FL:       70.1  mm     G. Age:  36w 0d         41  %    FL/AC:      22.2   %    20 - 24

Est. FW:    1868  gm      6 lb 1 oz     55  %
Gestational Age

Clinical EDD:  33w 0d                                        EDD:   01/01/17
U/S Today:     35w 6d                                        EDD:   12/12/16
Best:          36w 1d     Det. By:  U/S  (10/17/16)          EDD:   12/10/16
Anatomy

Cranium:               Previously seen        Aortic Arch:            Previously seen
Cavum:                 Previously seen        Ductal Arch:            Previously seen
Ventricles:            Previously seen        Diaphragm:              Previously seen
Choroid Plexus:        Previously seen        Stomach:                Appears normal, left
sided
Cerebellum:            Previously seen        Abdomen:                Previously seen
Posterior Fossa:       Not well visualized    Abdominal Wall:         Not well visualized
Nuchal Fold:           Not well visualized    Cord Vessels:           Previously seen
Face:                  Orbits nl; profile not Kidneys:                Appear normal
well visualized
Lips:                  Previously seen        Bladder:                Appears normal
Thoracic:              Previously seen        Spine:                  Previously seen
Heart:                 Previously seen        Upper Extremities:      Lt normal, rt not
well visualized
RVOT:                  Previously seen        Lower Extremities:      Previously seen
LVOT:                  Previously seen

Other:  Fetus appears to be a female. RT Heel visualized. Technically difficult
due to advanced gestational age.
Cervix Uterus Adnexa

Cervix
Length:           3.98  cm.
Normal appearance by transabdominal scan.
Impression

Singleton intrauterine pregnancy at 36+1 weeks. Late
prenatal care
Review of the anatomy shows no sonographic markers for
aneuploidy or structural anomalies
However, evaluations should be considered suboptimal
secondary to late gestational age
Amniotic fluid volume is normal with an AFI of 15 cm
Estimated fetal weight is 2746g which is growth in the 55th
percentile
Recommendations

Follow-up ultrasounds as clinically indicated.

## 2018-12-05 ENCOUNTER — Other Ambulatory Visit: Payer: Self-pay

## 2018-12-05 ENCOUNTER — Encounter (HOSPITAL_COMMUNITY): Payer: Self-pay

## 2018-12-05 ENCOUNTER — Ambulatory Visit (HOSPITAL_COMMUNITY)
Admission: EM | Admit: 2018-12-05 | Discharge: 2018-12-05 | Disposition: A | Discharge status: Home / Self Care | Payer: Medicaid Other | Attending: Emergency Medicine | Admitting: Emergency Medicine

## 2018-12-05 DIAGNOSIS — H6123 Impacted cerumen, bilateral: Secondary | ICD-10-CM

## 2018-12-05 DIAGNOSIS — J45901 Unspecified asthma with (acute) exacerbation: Secondary | ICD-10-CM

## 2018-12-05 DIAGNOSIS — H60502 Unspecified acute noninfective otitis externa, left ear: Secondary | ICD-10-CM

## 2018-12-05 MED ORDER — AEROCHAMBER PLUS MISC: 2 refills | Status: AC

## 2018-12-05 MED ORDER — DEXAMETHASONE 4 MG PO TABS: ORAL_TABLET | ORAL | 0 refills | Status: DC

## 2018-12-05 MED ORDER — ALBUTEROL SULFATE HFA 108 (90 BASE) MCG/ACT IN AERS: 2.0000 | INHALATION_SPRAY | Freq: Four times a day (QID) | RESPIRATORY_TRACT | 2 refills | Status: DC | PRN

## 2018-12-05 MED ORDER — TOBRAMYCIN-DEXAMETHASONE 0.3-0.1 % OP SUSP: 3.0000 [drp] | Freq: Three times a day (TID) | OPHTHALMIC | 0 refills | Status: AC

## 2018-12-05 NOTE — ED Provider Notes (Signed)
HPI  SUBJECTIVE:  Bonnie Wilson is a 38 y.o. female who presents with 3 days of chest tightness, wheezing, shortness of breath, dyspnea on exertion that she says is identical to previous asthma exacerbations.  No cough.  She has had asthma for 30 years.  She states that she used up an entire albuterol inhaler in 2 days.  She does not have a spacer.  She thinks that this was triggered by the change in weather.  She also reports left ear pain.  No change in hearing, otorrhea, fevers.  She tried her albuterol inhaler, Alka-Seltzer cold medication without improvement of symptoms.  No aggravating factors.  No antipyretics in the past 4-6 hours.  She has a past medical history of asthma. she is on Flovent, states that she is compliant with this.  She has remote history of admissions for asthma.  No recent steroid use, intubations, diabetes, hypertension, PE, DVT.  LMP: 1/19.  Denies possibility being pregnant.  ZOX:WRUEPMD:Beal, Lavonna RuaSheri, PA-C   Past Medical History:  Diagnosis Date  . Asthma   . Fibromyalgia     Past Surgical History:  Procedure Laterality Date  . TONSILLECTOMY     at age 89 per pt    Family History  Problem Relation Age of Onset  . Hypertension Mother   . Asthma Father   . Hypertension Father   . Asthma Paternal Aunt     Social History   Tobacco Use  . Smoking status: Never Smoker  . Smokeless tobacco: Never Used  Substance Use Topics  . Alcohol use: No  . Drug use: No    No current facility-administered medications for this encounter.   Current Outpatient Medications:  .  albuterol (PROVENTIL HFA;VENTOLIN HFA) 108 (90 Base) MCG/ACT inhaler, Inhale 2 puffs into the lungs every 6 (six) hours as needed for wheezing or shortness of breath., Disp: 1 Inhaler, Rfl: 2 .  albuterol (PROVENTIL) (2.5 MG/3ML) 0.083% nebulizer solution, USE 1 VIAL IN NEBULIZER EVERY 4 HOURS AS NEEDED FOR WHEEZING/SHORTNESS OF BREATH (Patient not taking: Reported on 01/22/2017), Disp: 75 mL, Rfl: 0 .   beclomethasone (QVAR) 80 MCG/ACT inhaler, INHALE 2 PUFFS INTO THE LUNGS 2 (TWO) TIMES DAILY., Disp: 8.7 g, Rfl: 0 .  dexamethasone (DECADRON) 4 MG tablet, 4 tablets (16 mg) po at once on day 1 and 4 tabs (16 mg) po at once on day 2, Disp: 8 tablet, Rfl: 0 .  norethindrone (ORTHO MICRONOR) 0.35 MG tablet, Take 1 tablet (0.35 mg total) by mouth daily., Disp: 1 Package, Rfl: 11 .  Spacer/Aero-Holding Chambers (AEROCHAMBER PLUS) inhaler, Use as instructed, Disp: 1 each, Rfl: 2 .  tobramycin-dexamethasone (TOBRADEX) ophthalmic solution, Place 3 drops into the left ear 3 (three) times daily. X 5 days, Disp: 5 mL, Rfl: 0 .  triamcinolone cream (KENALOG) 0.1 %, APPLY SPARINGLY TOPICALLY TWICE DAILY. PLEASE SCHEDULE A PHYSICAL., Disp: 45 g, Rfl: 0  Allergies  Allergen Reactions  . Sulfa Antibiotics Anaphylaxis and Rash  . Allegra [Fexofenadine Hcl] Hives and Other (See Comments)    Causes hyperactivity   . Claritin [Loratadine] Hives and Other (See Comments)    Cause hyperactivity  . Other     Nuts, limes, uncooked apples, coconut, watermelon, black eyed peas  . Latex Hives and Rash     ROS  As noted in HPI.   Physical Exam  BP 131/89 (BP Location: Left Arm)   Pulse 78   Temp 98.1 F (36.7 C) (Oral)   Resp 18  Wt 126.1 kg   LMP 11/16/2018   SpO2 99%   BMI 41.05 kg/m   Constitutional: Well developed, well nourished, no acute distress, speaking n full sentences Eyes:  EOMI, conjunctiva normal bilaterally HENT: Normocephalic, atraumatic,mucus membranes moist left external ear normal.  Pain with traction on pinna and palpation of tragus.  No tenderness over the mastoid.  TM obscured by cerumen.  Right TM normal. Respiratory: Normal inspiratory effort, poor air movement, lungs clear bilaterally.  No chest wall tenderness. Cardiovascular: Normal rate regular rhythm, no murmurs rubs or gallops GI: nondistended skin: No rash, skin intact Musculoskeletal: no deformities Neurologic: Alert  & oriented x 3, no focal neuro deficits Psychiatric: Speech and behavior appropriate   ED Course   Medications - No data to display  No orders of the defined types were placed in this encounter.   No results found for this or any previous visit (from the past 24 hour(s)). No results found.  ED Clinical Impression  Exacerbation of asthma, unspecified asthma severity, unspecified whether persistent  Bilateral impacted cerumen  Acute otitis externa of left ear, unspecified type   ED Assessment/Plan  1.  Asthma exacerbation.  Sending home with Ventolin as she states that prior does not work for her, spacer, dexamethasone 16 mg for 2 days.  Doubt pneumonia at this point in time.  2.  Cerumen impaction.  Attempted to curette wax out with partial removal.  We will have the ear irrigated and reevaluate.  Reevaluation, still unable to see the eardrum.  She does have pain with traction on pinna and palpation of tragus.  External ear canal and external ear appears normal.  I suspect otitis externa from cerumen impaction.  Advised that she will need to see ENT.  Will send home with antibiotic eardrops.  Discussed  MDM, treatment plan, and plan for follow-up with patient. patient agrees with plan.   Meds ordered this encounter  Medications  . dexamethasone (DECADRON) 4 MG tablet    Sig: 4 tablets (16 mg) po at once on day 1 and 4 tabs (16 mg) po at once on day 2    Dispense:  8 tablet    Refill:  0  . Spacer/Aero-Holding Chambers (AEROCHAMBER PLUS) inhaler    Sig: Use as instructed    Dispense:  1 each    Refill:  2  . albuterol (PROVENTIL HFA;VENTOLIN HFA) 108 (90 Base) MCG/ACT inhaler    Sig: Inhale 2 puffs into the lungs every 6 (six) hours as needed for wheezing or shortness of breath.    Dispense:  1 Inhaler    Refill:  2  . tobramycin-dexamethasone (TOBRADEX) ophthalmic solution    Sig: Place 3 drops into the left ear 3 (three) times daily. X 5 days    Dispense:  5 mL     Refill:  0    *This clinic note was created using Scientist, clinical (histocompatibility and immunogenetics). Therefore, there may be occasional mistakes despite careful proofreading.   ?    Domenick Gong, MD 12/05/18 1744

## 2018-12-05 NOTE — ED Triage Notes (Signed)
Pt cc asthmas flared x 4 days and left ear discomfort. X 3 days.

## 2018-12-09 ENCOUNTER — Ambulatory Visit: Payer: Medicaid Other | Admitting: Nurse Practitioner

## 2018-12-11 ENCOUNTER — Encounter: Payer: Self-pay | Admitting: Pulmonary Disease

## 2018-12-11 ENCOUNTER — Encounter: Payer: Self-pay | Admitting: General Surgery

## 2018-12-11 ENCOUNTER — Ambulatory Visit: Payer: Medicaid Other | Admitting: Pulmonary Disease

## 2018-12-11 ENCOUNTER — Ambulatory Visit (INDEPENDENT_AMBULATORY_CARE_PROVIDER_SITE_OTHER): Payer: Medicaid Other | Admitting: Pulmonary Disease

## 2018-12-11 VITALS — BP 124/70 | HR 92 | Ht 69.0 in | Wt 297.2 lb

## 2018-12-11 DIAGNOSIS — J301 Allergic rhinitis due to pollen: Secondary | ICD-10-CM

## 2018-12-11 DIAGNOSIS — J4550 Severe persistent asthma, uncomplicated: Secondary | ICD-10-CM | POA: Diagnosis not present

## 2018-12-11 MED ORDER — BUDESONIDE-FORMOTEROL FUMARATE 160-4.5 MCG/ACT IN AERO: 2.0000 | INHALATION_SPRAY | Freq: Two times a day (BID) | RESPIRATORY_TRACT | 6 refills | Status: DC

## 2018-12-11 MED ORDER — ALBUTEROL SULFATE HFA 108 (90 BASE) MCG/ACT IN AERS: 2.0000 | INHALATION_SPRAY | Freq: Four times a day (QID) | RESPIRATORY_TRACT | 5 refills | Status: DC | PRN

## 2018-12-11 NOTE — Patient Instructions (Signed)
Stop flovent  Start symbicort two puffs in the morning and two puffs in the evening, and rinse your mouth after each use  Singulair 10 mg pill nightly  Will try to get ventolin prescription filled  Follow up in 4 weeks with Dr. Craige Cotta or Nurse Practitioner

## 2018-12-11 NOTE — Progress Notes (Signed)
[facility]  Chief Complaint  Patient presents with  . Consult    since weather change, breathings been "terrible"; insurance won't cover rescue inhalers (Ventolin)    Constitutional:  BP 124/70 (BP Location: Right Arm, Cuff Size: Normal)   Pulse 92   Ht 5\' 9"  (1.753 m)   Wt 297 lb 3.2 oz (134.8 kg)   LMP 11/16/2018   SpO2 95%   BMI 43.89 kg/m   Past Medical History:  Fibromyalgia  Brief Summary:  Bonnie Wilson is a 38 y.o. female with cough.  She was diagnosed with allergies and asthma as a child. Previously seen by an allergist.  There was some discussion about allergy shots, but this was never done.  She was told she is allergic to everything.  Spring and Fall tend to be worse time of year.  She also gets eczema.  She has sinus congestion with post nasal drip.  Gets cough, wheeze, and clear sputum.  Just completed course of decadron for asthma flare.  No problem with aspirin/NSAIDs.  Was using advair and this helped.  Was using Qvar and symptoms got worse.  Recently changed to Flovent and singulair.  Has tried proair and proventil >> both of these caused mouth sores and she wasn't able to use these.  She has been able to tolerate ventolin HFA and this has helped her breathing.  She has been getting trouble with insurance coverage for ventolin.  She was using this at least daily.  [place], but has lived in [place] about [place] years.  Works as a Investment banker, operationalchef.  Has a pet dog.  Never smoked.  No history of pneumonia or TB.  Her father has asthma.   Physical Exam:   Appearance - well kempt   ENMT - clear nasal drainage, boggy nasal mucosa, midline nasal septum, no oral exudates, no LAN, trachea midline  Respiratory - normal chest wall, normal respiratory effort, no accessory muscle use, no wheeze/rales  CV - s1s2 regular rate and rhythm, no murmurs, no peripheral edema, radial pulses symmetric  GI - soft, non tender, no  masses  Lymph - no adenopathy noted in neck and axillary areas  MSK - normal gait  Ext - no cyanosis, clubbing, or joint inflammation noted  Skin - no rashes, lesions, or ulcers  Neuro - normal strength, oriented x 3  Psych - normal mood and affect  Discussion:  She has asthma and rhinitis with an allergic profile.  She has intolerance to several asthma medications, specifically proair and proventil.  These have caused mouth sores.  She was recently on decadron for an exacerbation.  Assessment/Plan:   Allergic asthma. - severe, persistent - will add symbicort in place of flovent - continue singulair - she has been intolerant of proair and proventil - will try to get ventolin filled as her rescue inhaler - she will need PFT and lab tests (CBC with diff, RAST with Ige) once she has more time to clear systemic steroids from her system  Allergic rhinitis. - singulair, flonase - she has allergic reaction to several OTC antihistamines   Patient Instructions  Stop flovent  Start symbicort two puffs in the morning and two puffs in the evening, and rinse your mouth after each use  Singulair 10 mg pill nightly  Will try to get ventolin prescription filled  Follow up in 4 weeks with Dr. Craige CottaSood or Nurse Practitioner    Coralyn HellingVineet Myrel Rappleye, MD [place]  [facility]/Critical Care Pager: [phone] 12/11/2018, 10:58 AM  Flow Sheet     Pulmonary tests:    Review of Systems:  Constitutional: Negative for fever.  HENT: Positive for ear pain. Negative for congestion, dental problem, nosebleeds, postnasal drip, rhinorrhea, sinus pressure, sneezing, sore throat and trouble swallowing.   Eyes: Negative for redness and itching.  Respiratory: Positive for cough, shortness of breath and wheezing. Negative for chest tightness.   Cardiovascular: Negative for palpitations and leg swelling.  Gastrointestinal: Negative for nausea and vomiting.  Genitourinary: Negative for dysuria.    Musculoskeletal: Negative for joint swelling.  Skin: Negative for rash.  Allergic/Immunologic: Positive for environmental allergies and food allergies. Negative for immunocompromised state.  Neurological: Negative for headaches.  Hematological: Does not bruise/bleed easily.  Psychiatric/Behavioral: Negative for dysphoric mood. The patient is not nervous/anxious.    Medications:   Allergies as of 12/11/2018      Reactions   Sulfa Antibiotics Anaphylaxis, Rash   Allegra [fexofenadine Hcl] Hives, Other (See Comments)   Causes hyperactivity    Claritin [loratadine] Hives, Other (See Comments)   Cause hyperactivity   Other    Nuts, limes, uncooked apples, coconut, watermelon, black eyed peas   Latex Hives, Rash      Medication List       Accurate as of December 11, 2018 10:58 AM. Always use your most recent med list.        AEROCHAMBER PLUS inhaler Use as instructed   albuterol (2.5 MG/3ML) 0.083% nebulizer solution Commonly known as:  PROVENTIL USE 1 VIAL IN NEBULIZER EVERY 4 HOURS AS NEEDED FOR WHEEZING/SHORTNESS OF BREATH   albuterol 108 (90 Base) MCG/ACT inhaler Commonly known as:  VENTOLIN HFA Inhale 2 puffs into the lungs every 6 (six) hours as needed for wheezing or shortness of breath.   budesonide-formoterol 160-4.5 MCG/ACT inhaler Commonly known as:  SYMBICORT Inhale 2 puffs into the lungs 2 (two) times daily.   montelukast 10 MG tablet Commonly known as:  SINGULAIR Take 10 mg by mouth at bedtime.   norethindrone 0.35 MG tablet Commonly known as:  ORTHO MICRONOR Take 1 tablet (0.35 mg total) by mouth daily.   tobramycin-dexamethasone ophthalmic solution Commonly known as:  TOBRADEX Place 3 drops into the left ear 3 (three) times daily. X 5 days   triamcinolone cream 0.1 % Commonly known as:  KENALOG APPLY SPARINGLY TOPICALLY TWICE DAILY. PLEASE SCHEDULE A PHYSICAL.       Past Surgical History:  She  has a past surgical history that includes  Tonsillectomy.  Family History:  Her family history includes Asthma in her father and paternal aunt; Hypertension in her father and mother.  Social History:  She  reports that she has never smoked. She has never used smokeless tobacco. She reports that she does not drink alcohol or use drugs.

## 2018-12-11 NOTE — Progress Notes (Signed)
   Subjective:    Patient ID: Bonnie Wilson, female    DOB: Oct 13, 1981, 38 y.o.   MRN: [id number]  HPI    Review of Systems  Constitutional: Negative for fever.  HENT: Positive for ear pain. Negative for congestion, dental problem, nosebleeds, postnasal drip, rhinorrhea, sinus pressure, sneezing, sore throat and trouble swallowing.   Eyes: Negative for redness and itching.  Respiratory: Positive for cough, shortness of breath and wheezing. Negative for chest tightness.   Cardiovascular: Negative for palpitations and leg swelling.  Gastrointestinal: Negative for nausea and vomiting.  Genitourinary: Negative for dysuria.  Musculoskeletal: Negative for joint swelling.  Skin: Negative for rash.  Allergic/Immunologic: Positive for environmental allergies and food allergies. Negative for immunocompromised state.  Neurological: Negative for headaches.  Hematological: Does not bruise/bleed easily.  Psychiatric/Behavioral: Negative for dysphoric mood. The patient is not nervous/anxious.        Objective:   Physical Exam        Assessment & Plan:

## 2019-01-20 ENCOUNTER — Ambulatory Visit: Payer: Medicaid Other | Admitting: Pulmonary Disease

## 2019-01-29 ENCOUNTER — Other Ambulatory Visit: Payer: Self-pay | Admitting: *Deleted

## 2019-01-29 MED ORDER — ALBUTEROL SULFATE HFA 108 (90 BASE) MCG/ACT IN AERS: 2.0000 | INHALATION_SPRAY | Freq: Four times a day (QID) | RESPIRATORY_TRACT | 5 refills | Status: DC | PRN

## 2019-02-05 ENCOUNTER — Ambulatory Visit: Payer: Medicaid Other | Admitting: Pulmonary Disease

## 2019-08-02 ENCOUNTER — Other Ambulatory Visit: Payer: Self-pay | Admitting: Pulmonary Disease

## 2020-02-03 ENCOUNTER — Other Ambulatory Visit: Payer: Self-pay | Admitting: Pulmonary Disease

## 2020-03-14 ENCOUNTER — Other Ambulatory Visit: Payer: Self-pay | Admitting: Pulmonary Disease

## 2021-12-09 ENCOUNTER — Telehealth: Payer: Self-pay | Admitting: Pulmonary Disease

## 2021-12-09 NOTE — Telephone Encounter (Signed)
This patient was on [facility] Schedule and she is a new patient for Dr. Halford Chessman. She will need a new patient. Please get her re-scheduled as a new patient with Dr. Halford Chessman.

## 2021-12-12 ENCOUNTER — Ambulatory Visit: Payer: Medicaid Other | Admitting: Nurse Practitioner

## 2021-12-22 ENCOUNTER — Ambulatory Visit (INDEPENDENT_AMBULATORY_CARE_PROVIDER_SITE_OTHER): Payer: Managed Care, Other (non HMO) | Admitting: Internal Medicine

## 2021-12-22 ENCOUNTER — Other Ambulatory Visit: Payer: Self-pay

## 2021-12-22 ENCOUNTER — Encounter: Payer: Self-pay | Admitting: Internal Medicine

## 2021-12-22 VITALS — BP 128/88 | HR 90 | Ht 69.0 in | Wt 308.4 lb

## 2021-12-22 DIAGNOSIS — J4540 Moderate persistent asthma, uncomplicated: Secondary | ICD-10-CM

## 2021-12-22 DIAGNOSIS — J302 Other seasonal allergic rhinitis: Secondary | ICD-10-CM | POA: Diagnosis not present

## 2021-12-22 LAB — NITRIC OXIDE: FeNO level (ppb): 101

## 2021-12-22 MED ORDER — ADVAIR HFA 230-21 MCG/ACT IN AERO: 2.0000 | INHALATION_SPRAY | Freq: Two times a day (BID) | RESPIRATORY_TRACT | 12 refills | Status: DC

## 2021-12-22 NOTE — Progress Notes (Signed)
Bonnie Wilson    DR:[id number]    [birth year]  Primary Care Physician:Beal, Bonnie Wilson  Referring Physician: Nicholes Rough, PA-C [address] [place],   [id number] Reason for Consultation: asthma Date of Consultation: 12/22/2021  Chief complaint:   Chief Complaint  Patient presents with   Consult    Raspy voice      HPI: Bonnie Wilson is a 41 y.o. woman who presents for new patient evaluation of asthma. She was last seen in our office by Dr. Halford Wilson 3 years ago. Here for Diagnosed with allergies and asthma since she was 41 years old  She says generic medications don't work for her Montelukast doesn't work, but Therapist, sports does.  She is allergic to allegra and claritin. Zyrtec doesn't help her. She is allergic to xyzal.  She gets hives and her symptoms get worse.   Taking over the counter decongestants. Previously taking benadryl with congestion and alka seltzer cold night  Pro air does not work, ventolin does  She does not take any nasal sprays.   Flovent is helping her a lot and she takes ventolin  Current Regimen: flovent BID and ventolin  Asthma Triggers: seasonal allergies, URIs Exacerbations in the last year: last needed steroids for her breathing in feb 2020 History of hospitalization or intubation: never Allergy Testing: not had  GERD: yes Allergic Rhinitis: ACT: No flowsheet data found. FeNO:101 ppb   Social history: Never smoker,no passive smoke exposure in childhood.   Social History   Occupational History   Not on file  Tobacco Use   Smoking status: Never   Smokeless tobacco: Never  Substance and Sexual Activity   Alcohol use: No   Drug use: No   Sexual activity: Yes    Relevant family history:  Family History  Problem Relation Age of Onset   Hypertension Mother    Asthma Father    Hypertension Father    Asthma Paternal Aunt     Past Medical History:  Diagnosis Date   Asthma    Fibromyalgia     Past Surgical History:   Procedure Laterality Date   TONSILLECTOMY     at age 2 per pt     Physical Exam: Blood pressure 128/88, pulse 90, height 5\' 9"  (1.753 m), weight (!) 308 lb 6.4 oz (139.9 kg), SpO2 97 %, currently breastfeeding. Gen:      No acute distress, frequent coughing.  ENT:  nearly obstructed bilateral nasal turbinades. no nasal polyps, mucus membranes moist Lungs:    No increased respiratory effort, symmetric chest wall excursion, clear to auscultation bilaterally, no wheezes or crackles CV:         Regular rate and rhythm; no murmurs, rubs, or gallops.  No pedal edema Abd:      + bowel sounds; soft, non-tender; no distension MSK: no acute synovitis of DIP or PIP joints, no mechanics hands.  Skin:      Warm and dry; no rashes Neuro: normal speech, no focal facial asymmetry Psych: alert and oriented x3, normal mood and affect   Data Reviewed/Medical Decision Making:  Independent interpretation of tests: Imaging:  PFTs: I have personally reviewed the patient's PFTs and spirometry today is normal No flowsheet data found.  Labs:  Lab Results  Component Value Date   WBC 6.1 01/22/2017   HGB 12.3 01/22/2017   HCT 39.2 01/22/2017   MCV 84 01/22/2017   PLT 305 01/22/2017   Lab Results  Component Value Date  NA 136 06/09/2014   K 4.7 06/09/2014   CL 103 06/09/2014   CO2 28 06/09/2014   AEC in 2017 was 268  Immunization status:  Immunization History  Administered Date(s) Administered   Tdap 10/30/2002, 12/18/2016     I reviewed prior external note(s) from pulmonary, ED  I reviewed the result(s) of the labs and imaging as noted above.   I have ordered pfts, blood work  Discussion of management or test interpretation with another colleague  - allergy  Assessment:  Moderate persistent asthma not well controlled Severe chronic rhinitis, with allergic component, not well controlled   Plan/Recommendations:  Up asthma therapy from flovent to high dose advair HFA with spacer,  prn albuterol.  I am hesitant to change her rhinitis therapy given her history of intolerances before getting blood work. Will consult with allergy for further guidance.   Will obtain blood work today - region 2 immunocap testing, CBC with diff  Spirometry and Feno obtained today  We discussed disease management and progression at length today.    Return to Care: Return in about 3 months (around 03/21/2022).  Bonnie Llamas, MD [facility] [place]  CC: Bonnie Wilson, [place]

## 2021-12-22 NOTE — Patient Instructions (Signed)
Please schedule follow up scheduled with myself in 3 months.  If my schedule is not open yet, we will contact you with a reminder closer to that time. Please call [phone] if you haven't heard from [place] a month before.   Before your next visit I would like you to have:  Get some blood work done to help your allergies and asthma.  Stop flovent. Switch to advair 2 puffs in the morning, 2 puffs at night, gargle after use.  I am having you see an allergy doctor to help with your rhinitis and congestion.

## 2022-02-14 ENCOUNTER — Ambulatory Visit: Payer: Medicaid Other | Admitting: Allergy & Immunology

## 2022-03-22 ENCOUNTER — Ambulatory Visit: Payer: Managed Care, Other (non HMO) | Admitting: Internal Medicine

## 2023-01-16 ENCOUNTER — Other Ambulatory Visit: Payer: Self-pay | Admitting: Internal Medicine
